# Patient Record
Sex: Female | Born: 1965
Health system: Southern US, Community
[De-identification: ages and names within clinical notes are randomized; demographics above are authoritative.]

## PROBLEM LIST (undated history)

## (undated) DIAGNOSIS — Z789 Other specified health status: Secondary | ICD-10-CM

## (undated) DIAGNOSIS — J45909 Unspecified asthma, uncomplicated: Secondary | ICD-10-CM

## (undated) DIAGNOSIS — N946 Dysmenorrhea, unspecified: Secondary | ICD-10-CM

## (undated) DIAGNOSIS — R202 Paresthesia of skin: Secondary | ICD-10-CM

## (undated) DIAGNOSIS — IMO0001 Reserved for inherently not codable concepts without codable children: Secondary | ICD-10-CM

## (undated) DIAGNOSIS — Z818 Family history of other mental and behavioral disorders: Secondary | ICD-10-CM

## (undated) DIAGNOSIS — R200 Anesthesia of skin: Secondary | ICD-10-CM

## (undated) DIAGNOSIS — I8390 Asymptomatic varicose veins of unspecified lower extremity: Secondary | ICD-10-CM

## (undated) DIAGNOSIS — M25511 Pain in right shoulder: Secondary | ICD-10-CM

## (undated) DIAGNOSIS — Z464 Encounter for fitting and adjustment of orthodontic device: Secondary | ICD-10-CM

## (undated) DIAGNOSIS — D649 Anemia, unspecified: Secondary | ICD-10-CM

## (undated) HISTORY — DX: Other specified health status: Z78.9

## (undated) HISTORY — DX: Dysmenorrhea, unspecified: N94.6

## (undated) HISTORY — DX: Family history of other mental and behavioral disorders: Z81.8

---

## 2014-02-25 HISTORY — PX: CATARACT EXTRACTION: SUR2

## 2015-02-26 HISTORY — PX: VARICOSE VEIN SURGERY: SHX832

## 2015-04-26 HISTORY — PX: AUGMENTATION MAMMAPLASTY: SUR837

## 2016-04-26 ENCOUNTER — Ambulatory Visit (INDEPENDENT_AMBULATORY_CARE_PROVIDER_SITE_OTHER): Payer: BLUE CROSS/BLUE SHIELD | Admitting: Obstetrics and Gynecology

## 2016-04-26 ENCOUNTER — Encounter: Payer: Self-pay | Admitting: Obstetrics and Gynecology

## 2016-04-26 ENCOUNTER — Ambulatory Visit: Payer: Self-pay | Admitting: Obstetrics and Gynecology

## 2016-04-26 VITALS — BP 100/62 | HR 70 | Resp 16 | Ht 63.5 in | Wt 121.8 lb

## 2016-04-26 DIAGNOSIS — R1900 Intra-abdominal and pelvic swelling, mass and lump, unspecified site: Secondary | ICD-10-CM | POA: Diagnosis not present

## 2016-04-26 DIAGNOSIS — R5383 Other fatigue: Secondary | ICD-10-CM | POA: Diagnosis not present

## 2016-04-26 LAB — CBC
HCT: 43.1 % (ref 35.0–45.0)
Hemoglobin: 14.2 g/dL (ref 11.7–15.5)
MCH: 31.6 pg (ref 27.0–33.0)
MCHC: 32.9 g/dL (ref 32.0–36.0)
MCV: 96 fL (ref 80.0–100.0)
MPV: 11.2 fL (ref 7.5–12.5)
PLATELETS: 294 10*3/uL (ref 140–400)
RBC: 4.49 MIL/uL (ref 3.80–5.10)
RDW: 14.1 % (ref 11.0–15.0)
WBC: 7.4 10*3/uL (ref 3.8–10.8)

## 2016-04-26 LAB — COMPREHENSIVE METABOLIC PANEL
ALT: 12 U/L (ref 6–29)
AST: 16 U/L (ref 10–35)
Albumin: 4.3 g/dL (ref 3.6–5.1)
Alkaline Phosphatase: 31 U/L — ABNORMAL LOW (ref 33–130)
BUN: 12 mg/dL (ref 7–25)
CHLORIDE: 99 mmol/L (ref 98–110)
CO2: 27 mmol/L (ref 20–31)
Calcium: 9.2 mg/dL (ref 8.6–10.4)
Creat: 0.69 mg/dL (ref 0.50–1.05)
GLUCOSE: 75 mg/dL (ref 65–99)
POTASSIUM: 3.7 mmol/L (ref 3.5–5.3)
Sodium: 137 mmol/L (ref 135–146)
Total Bilirubin: 0.6 mg/dL (ref 0.2–1.2)
Total Protein: 7 g/dL (ref 6.1–8.1)

## 2016-04-26 LAB — TSH: TSH: 1.57 mIU/L

## 2016-04-26 NOTE — Progress Notes (Signed)
GYNECOLOGY  VISIT   HPI: 51 y.o.   Married  Caucasian/[place]ian  female   Crystal Gardner with Patient's last menstrual period was 04/18/2016 (exact date).   here for  Pelvic pain.   Husband present for discussion today.   Saw a physician in [place] who diagnosed a complex left adnexal mass in Sept. 25, 2017.  Had follow up ultrasound 01/08/16 showing persistent mass.  Surgery was recommended.   Patient dealing with a lot of stress. International moves, visa, husband's child died from heroin overdose 2 months ago. She feels tired.  Patient living now in [place].   GYNECOLOGIC HISTORY: Patient's last menstrual period was 04/18/2016 (exact date). Contraception:  none Menopausal hormone therapy:  n/a Last mammogram:  04/2015 normal per patient in [place] Last pap smear:   10/2015 normal per patient in [place]        OB History    Gravida Para Term Preterm AB Living   1 1 1     1    SAB TAB Ectopic Multiple Live Births                     There are no active problems to display for this patient.   Past Medical History:  Diagnosis Date  . Dysmenorrhea   . Family history not obtainable due to adoption     Past Surgical History:  Procedure Laterality Date  . AUGMENTATION MAMMAPLASTY  [id number]   silicone implants--[place]  . CATARACT EXTRACTION Right 2016   --in [place]    Current Outpatient Prescriptions  Medication Sig Dispense Refill  . cholecalciferol (VITAMIN D) 1000 units tablet Take 1,000 Units by mouth daily.    [name] Kitchen FLAXSEED, LINSEED, PO Take 1 tablet by mouth daily.    [name] Kitchen ibuprofen (ADVIL,MOTRIN) 200 MG tablet Take 200 mg by mouth every 6 (six) hours as needed. Takes 2 tablets daily    . Omega-3 1000 MG CAPS Take 1 capsule by mouth daily.    Marnee Guarneri Isoflavones (SOY BALANCE PO) Take 1 tablet by mouth daily.    [name] Kitchen VITAMIN A PO Take 1 tablet by mouth daily.     No current facility-administered medications for this visit.      ALLERGIES: Patient has no known  allergies.  Family History  Problem Relation Age of Onset  . Adopted: Yes    Social History   Social History  . Marital status: Married    Spouse name: N/A  . Number of children: N/A  . Years of education: N/A   Occupational History  . Not on file.   Social History Main Topics  . Smoking status: Never Smoker  . Smokeless tobacco: Never Used  . Alcohol use 4.8 oz/week    8 Glasses of wine per week  . Drug use: Yes    Types: Marijuana  . Sexual activity: Yes    Birth control/ protection: None   Other Topics Concern  . Not on file   Social History Narrative  . No narrative on file    ROS:  Pertinent items are noted in HPI.  PHYSICAL EXAMINATION:    BP 100/62 (BP Location: Right Arm, Patient Position: Sitting, Cuff Size: Normal)   Pulse 70   Resp 16   Ht 5' 3.5" (1.613 m)   Wt 121 lb 12.8 oz (55.2 kg)   LMP 04/18/2016 (Exact Date)   BMI 21.24 kg/m     General appearance: alert, cooperative and appears stated age Head: Normocephalic, without obvious abnormality, atraumatic  Neck: no adenopathy, supple, symmetrical, trachea midline and thyroid normal to inspection and palpation Lungs: clear to auscultation bilaterally Breasts: bilateral implants, normal appearance, no masses or tenderness, No nipple retraction or dimpling, No nipple discharge or bleeding, No axillary or supraclavicular adenopathy Heart: regular rate and rhythm Abdomen: soft, non-tender, no masses,  no organomegaly Extremities: extremities normal, atraumatic, no cyanosis or edema Skin: Skin color, texture, turgor normal. No rashes or lesions Lymph nodes: Cervical, supraclavicular, and axillary nodes normal. No abnormal inguinal nodes palpated Neurologic: Grossly normal  Pelvic: External genitalia:  no lesions              Urethra:  normal appearing urethra with no masses, tenderness or lesions              Bartholins and Skenes: normal                 Vagina: normal appearing vagina with normal  color and discharge, no lesions              Cervix: no lesions                Bimanual Exam:  Uterus:  normal size, contour, position, consistency, mobility, non-tender              Adnexa: no mass, fullness, tenderness on right.  Left adnexal fullness which is slightly tender.               Rectal exam: Yes.  .  Confirms.              Anus:  normal sphincter tone, no lesions  Chaperone was present for exam.  ASSESSMENT  Left adnexal mass.  Fatigue.   PLAN  Will check CBC, TSH, CMP, vit D. Discussion of ovarian cysts.  Pelvic ultrasound next week.  Will check CA125 after ultrasound to evaluate cyst better.    [facility] so she can call to make this appointment.    An After Visit Summary was printed and given to the patient.  ___30___ minutes face to face time of which over 50% was spent in counseling.

## 2016-04-26 NOTE — Progress Notes (Signed)
Following appointment with Dr Quincy Simmonds, pelvic ultrasound scheduled for 05-02-16 at 1100 here in office. Patient and husband agreeable to date and time.

## 2016-04-27 LAB — VITAMIN D 25 HYDROXY (VIT D DEFICIENCY, FRACTURES): Vit D, 25-Hydroxy: 34 ng/mL (ref 30–100)

## 2016-04-29 ENCOUNTER — Telehealth: Payer: Self-pay | Admitting: Obstetrics and Gynecology

## 2016-04-29 NOTE — Telephone Encounter (Signed)
Called patient's spouse to review benefits for procedure. Left voicemail to call back and review.  Per note from order and patient's most recent DPR ok to speak with spouse Mr. [name] Poag due to language barrier.

## 2016-04-29 NOTE — Telephone Encounter (Signed)
Spoke with Mr. Crystal Gardner. Relayed all benefit and appointment information and answered all questions. No further concerns. Ok to close.

## 2016-05-01 ENCOUNTER — Telehealth: Payer: Self-pay | Admitting: Obstetrics and Gynecology

## 2016-05-01 NOTE — Telephone Encounter (Signed)
Spoke with patients spouse, Crystal Gardner (he is listed on the [facility]) he and the patient are agreeable to the appointment time change for 05/02/16. Crystal Gardner is aware the appointment time for 05/02/16 is 12:30 and not 11:00  Routing to [employer]

## 2016-05-01 NOTE — Telephone Encounter (Signed)
Called patient and left a voicemail requesting a return call. Upon return call will need to convey to patient appointment time for ultrasound on 05/02/16 has been moved from 11:00 am  to 12:30 pm tomorrow, 05/02/16.   Routing to [employer]

## 2016-05-01 NOTE — Telephone Encounter (Signed)
Routing to provider for final review. Patient agreeable to disposition. Will close encounter.     

## 2016-05-02 ENCOUNTER — Encounter: Payer: Self-pay | Admitting: Obstetrics and Gynecology

## 2016-05-02 ENCOUNTER — Other Ambulatory Visit: Payer: BLUE CROSS/BLUE SHIELD | Admitting: Obstetrics and Gynecology

## 2016-05-02 ENCOUNTER — Ambulatory Visit (INDEPENDENT_AMBULATORY_CARE_PROVIDER_SITE_OTHER): Payer: BLUE CROSS/BLUE SHIELD | Admitting: Obstetrics and Gynecology

## 2016-05-02 ENCOUNTER — Ambulatory Visit (INDEPENDENT_AMBULATORY_CARE_PROVIDER_SITE_OTHER): Payer: BLUE CROSS/BLUE SHIELD

## 2016-05-02 ENCOUNTER — Other Ambulatory Visit: Payer: BLUE CROSS/BLUE SHIELD

## 2016-05-02 VITALS — BP 110/68 | HR 70 | Ht 63.5 in | Wt 121.0 lb

## 2016-05-02 DIAGNOSIS — N83202 Unspecified ovarian cyst, left side: Secondary | ICD-10-CM | POA: Diagnosis not present

## 2016-05-02 DIAGNOSIS — R1900 Intra-abdominal and pelvic swelling, mass and lump, unspecified site: Secondary | ICD-10-CM | POA: Diagnosis not present

## 2016-05-02 NOTE — Progress Notes (Signed)
Patient ID: Crystal Gardner, female   DOB: [birth year], 51 y.o.   MRN: [id number] GYNECOLOGY  VISIT   HPI: 51 y.o.   Married  Caucasian/[place] female   Sylvester with Patient's last menstrual period was 04/18/2016 (exact date).   here for pelvic ultrasound for left adnexal mass.    Patient's husband present today.  Here for follow up of left adnexal mass present since September 2017 per patient report.  She came to her last visit with a note from her provider in [place] indicating the presence of a persistent left ovarian cyst which needed removal.  She had ultrasound in September and November 2017.  Menses regular. No excessive bleeding or prolonged menses or significant pain with cycles.  GYNECOLOGIC HISTORY: Patient's last menstrual period was 04/18/2016 (exact date). Contraception:  none Menopausal hormone therapy:  n/a Last mammogram:  04/2015 normal per patient in [place] Last pap smear:   9/2017normal per patient in [place]        OB History    Gravida Para Term Preterm AB Living   1 1 1     1    SAB TAB Ectopic Multiple Live Births                     There are no active problems to display for this patient.   Past Medical History:  Diagnosis Date  . Dysmenorrhea   . Family history not obtainable due to adoption     Past Surgical History:  Procedure Laterality Date  . AUGMENTATION MAMMAPLASTY  40/1027   silicone implants--[place]  . CATARACT EXTRACTION Right 2016   --in [place]    Current Outpatient Prescriptions  Medication Sig Dispense Refill  . cholecalciferol (VITAMIN D) 1000 units tablet Take 1,000 Units by mouth daily.    [name] Kitchen FLAXSEED, LINSEED, PO Take 1 tablet by mouth daily.    [name] Kitchen ibuprofen (ADVIL,MOTRIN) 200 MG tablet Take 200 mg by mouth every 6 (six) hours as needed. Takes 2 tablets daily    . Omega-3 1000 MG CAPS Take 1 capsule by mouth daily.    Marnee Guarneri Isoflavones (SOY BALANCE PO) Take 1 tablet by mouth daily.    [name] Kitchen VITAMIN A PO Take 1  tablet by mouth daily.     No current facility-administered medications for this visit.      ALLERGIES: Patient has no known allergies.  Family History  Problem Relation Age of Onset  . Adopted: Yes    Social History   Social History  . Marital status: Married    Spouse name: N/A  . Number of children: N/A  . Years of education: N/A   Occupational History  . Not on file.   Social History Main Topics  . Smoking status: Never Smoker  . Smokeless tobacco: Never Used  . Alcohol use 4.8 oz/week    8 Glasses of wine per week  . Drug use: Yes    Types: Marijuana  . Sexual activity: Yes    Birth control/ protection: None   Other Topics Concern  . Not on file   Social History Narrative  . No narrative on file    ROS:  Pertinent items are noted in HPI.  PHYSICAL EXAMINATION:    BP 110/68 (BP Location: Right Arm, Patient Position: Sitting, Cuff Size: Normal)   Pulse 70   Ht 5' 3.5" (1.613 m)   Wt 121 lb (54.9 kg)   LMP 04/18/2016 (Exact Date)   BMI 21.10 kg/m  General appearance: alert, cooperative and appears stated age   Pelvic ultrasound:   Uterus with 8 fibroids, intramural and subserosal, measuring approximately 1 - 2 cm.  EMS 5.99 mm. Right ovary normal.  Left ovary with cyst 41 mm, thick walled and thick partial septation, no abnormal blood flow.  Second cyst 20 mm with 5 mm calcification.  Dermoid? Follicle with 3 mm calcification.  CL also seen.  No free fluid.  ASSESSMENT  Persistent complex left adnexal mass.   Uterine fibroids.  Asymptomatic.    PLAN  Discussion of complex left adnexal mass, possible dermoid, cystadenoma. Will get CA125.  Limitations of use discussed in premenopausal women.  Can be elevated with fibroids, inflammation, endometriosis, menses, or with malignancy.  Discussion of laparoscopic left salpingo-oophorectomy, right salpingectomy, and collection of pelvic washings at a minimum for a surgical plan.  Hysterectomy is  optional but not necessary if the cyst is benign. Risks of laparoscopy include but are not limited to bleeding, infection, damage to surrounding organs, reaction to anesthesia, pneumonia, DVT, PE, death, hernia formation, and need for further surgery.    Surgical expectations and recovery discussed. If CA125 elevated, will send to [facility] for consultation.  An After Visit Summary was printed and given to the patient.  _40_____ minutes face to face time of which over 50% was spent in counseling.

## 2016-05-02 NOTE — Progress Notes (Signed)
Encounter reviewed by Dr. Brook Amundson C. Silva.  

## 2016-05-03 ENCOUNTER — Telehealth: Payer: Self-pay | Admitting: Obstetrics and Gynecology

## 2016-05-03 DIAGNOSIS — R971 Elevated cancer antigen 125 [CA 125]: Secondary | ICD-10-CM

## 2016-05-03 DIAGNOSIS — N83202 Unspecified ovarian cyst, left side: Secondary | ICD-10-CM

## 2016-05-03 LAB — CA 125: CA 125: 48 U/mL — ABNORMAL HIGH (ref <35)

## 2016-05-03 NOTE — Telephone Encounter (Signed)
Phone call to patient with test results.  No answer.  CA125 result is back.  It measures 48.  Upper limit of normal is 35, so this is minimally elevated.  I discussed with the patient having GYN ONC consultation if it is elevated.  I will follow through with this plan.  No details left on voice mail.  I just asked for her to return my call.

## 2016-05-04 ENCOUNTER — Encounter: Payer: Self-pay | Admitting: Obstetrics and Gynecology

## 2016-05-06 NOTE — Telephone Encounter (Signed)
Phone call to patient  NA.  I left a message asking her to return the call.  No details given.  I was calling because her CA125 is minimally elevated which was done to analyze her left ovarian cyst.  Our plan was referral to GYN ONC if this was elevated.  She is premenopausal and has fibroids also.

## 2016-05-07 NOTE — Telephone Encounter (Signed)
Patient and spouse returned your call from yesterday.  State you can call anytime tomorrow and look forward to hearing from you.

## 2016-05-08 NOTE — Telephone Encounter (Signed)
Phone call return to patient.  I discussed the mildly elevated CA125 in the setting of the left ovarian cyst and fibroid uterus and my recommendation to have a consultation with Dr. Denman George.  Patient understands and accepts this.  She will need to have an interpretor present for the consultation with Dr. Denman George.   Please facilitate the referral to Dr. Denman George.

## 2016-05-08 NOTE — Telephone Encounter (Signed)
Referral placed via EPIC to [place]. Spoke with Sharyn Lull at [employer] office who states records will need to be faxed in addition to referral in [facility] and she will contact the patient to schedule. Aware patient will need an interpretor for her consultation with Dr.Rossi. OV notes and labs from 05/02/2016 faxed to [facility] at [phone] with cover sheet and confirmation. Will hold chart to ensure she is scheduled.

## 2016-05-09 NOTE — Telephone Encounter (Signed)
Spoke with Crystal Gardner at [facility]. Per Sherril Croon does not have an appointment available for around a month or more. Appointment scheduled for earliest appointment with [facility] on 05/24/2016 at 9:45 am. Patient will need to arrive at 9:30 am. Spoke with patient's husband, Crystal Gardner, okay per ROI. Advised of appointment date and time. Crystal Gardner is agreeable to appointment date and time. Location provided as seen below. Reports it is okay for the patient to see a female provider and she will be okay with this. If not he will return call so adjustments can be made.  [address], [place] [address]  Main: [phone]   Routing to [place] for final review.

## 2016-05-09 NOTE — Telephone Encounter (Signed)
Thank you for facilitating this appointment with Dr. Fermin Schwab.  I would be happy for the patient to see him. You may close the encounter.

## 2016-05-09 NOTE — Telephone Encounter (Signed)
Spoke with patient's husband, [name]. Okay per ROI. [name] would like to review results. Husband has discussed results with his wife in [place], but wants to make sure he understands. Advised of all results and recommendations as seen below from [place]. Aware a referral has been placed to [facility] and her office will contact them to set up an appointment date and time. Advised I will keep an eye out for appointment to be scheduled. If not scheduled by this afternoon I will contact Dr.Rossi's office to check on status of scheduling. [name] is agreeable.

## 2016-05-24 ENCOUNTER — Encounter: Payer: Self-pay | Admitting: Gynecology

## 2016-05-24 ENCOUNTER — Ambulatory Visit: Payer: BLUE CROSS/BLUE SHIELD | Attending: Gynecology | Admitting: Gynecology

## 2016-05-24 VITALS — BP 106/62 | HR 78 | Temp 98.6°F | Resp 18 | Ht 63.98 in | Wt 119.5 lb

## 2016-05-24 DIAGNOSIS — R971 Elevated cancer antigen 125 [CA 125]: Secondary | ICD-10-CM | POA: Diagnosis not present

## 2016-05-24 DIAGNOSIS — N393 Stress incontinence (female) (male): Secondary | ICD-10-CM

## 2016-05-24 DIAGNOSIS — R1032 Left lower quadrant pain: Secondary | ICD-10-CM | POA: Diagnosis not present

## 2016-05-24 DIAGNOSIS — Z79899 Other long term (current) drug therapy: Secondary | ICD-10-CM | POA: Diagnosis not present

## 2016-05-24 DIAGNOSIS — N83202 Unspecified ovarian cyst, left side: Secondary | ICD-10-CM | POA: Insufficient documentation

## 2016-05-24 NOTE — Patient Instructions (Signed)
Please follow up with Dr Quincy Simmonds.  Dr Fermin Schwab will contact Dr Quincy Simmonds about the findings at this visit.

## 2016-05-24 NOTE — Progress Notes (Signed)
Consult Note: Gyn-Onc   Crystal Gardner 51 y.o. female  Chief Complaint  Patient presents with  . left ovarian cyst    Assessment :  Left ovarian mass which has been present for at least 2 years. Intermittent left lower quadrant pain and dyspareunia. History of stress urinary incontinence.  Plan: I had a good discussion with the patient and her husband (through an interpreter) regarding the findings on ultrasound and her pelvic symptoms. Despite the fact that the CA-125 is slightly elevated (48 units per mL) I believe this is a benign adnexal mass. I agree with Dr. Elza Rafter recommendation that the mass be removed laparoscopically. I also agree that removing the contralateral fallopian tube was appropriate. At the same time, if appropriate, her stress urinary incontinence should be addressed. All questions are answered. The patient will return to Dr. Quincy Simmonds for further gynecologic management.    HPI: 51 year old seen in consultation request of Dr. Rolly Pancake regarding management of a left adnexal mass. Over the mass and present for approximately 2 years. The patient has occasional left lower quadrant pain and dyspareunia. Ultrasound obtained on 05/12/2016 shows a left ovarian cyst with follicles measuring 4.1 x 2.8 x 3.3 cm with thick partial septation. There is no vascularity and the margins are smooth. There is some calcification present. The right ovary appears normal there is no free fluid in the pelvis CA-125 was 48 units per mL (the patient is premenopausal). Patient has regular cyclic menses and denies any past gynecologic history. She does have relatively new onset of urinary incontinence. She has her Pap smears were always been normal. Obstetrical history gravida 31 (52 year old son)   Review of Systems:10 point review of systems is negative except as noted in interval history.   Vitals: Blood pressure 106/62, pulse 78, temperature 98.6 F (37 C), temperature source Oral, resp. rate  18, height 5' 3.98" (1.625 m), weight 119 lb 8 oz (54.2 kg).  Physical Exam: General : The patient is a healthy woman in no acute distress.  HEENT: normocephalic, extraoccular movements normal; neck is supple without thyromegally  Lynphnodes: Supraclavicular and inguinal nodes not enlarged  Abdomen: Soft, non-tender, no ascites, no organomegally, no masses, no hernias  Pelvic:  EGBUS: Normal female  Vagina: Normal, no lesions  Urethra and Bladder: Normal, non-tender  Cervix: Normal  Uterus: Anterior small mobile nontender. Bi-manual examination: Non-tender; no adenxal masses or nodularity I'm unable to palpate a mass. Rectal: normal sphincter tone, no masses, no blood  Lower extremities: No edema or varicosities. Normal range of motion      No Known Allergies  Past Medical History:  Diagnosis Date  . Dysmenorrhea   . Family history not obtainable due to adoption     Past Surgical History:  Procedure Laterality Date  . AUGMENTATION MAMMAPLASTY  09/6576   silicone implants--[place]  . CATARACT EXTRACTION Right 2016   --in [place]    Current Outpatient Prescriptions  Medication Sig Dispense Refill  . cholecalciferol (VITAMIN D) 1000 units tablet Take 1,000 Units by mouth daily.    [name] Kitchen FLAXSEED, LINSEED, PO Take 1 tablet by mouth daily.    [name] Kitchen ibuprofen (ADVIL,MOTRIN) 200 MG tablet Take 200 mg by mouth every 6 (six) hours as needed. Takes 2 tablets daily    . Omega-3 1000 MG CAPS Take 1 capsule by mouth daily.    Marnee Guarneri Isoflavones (SOY BALANCE PO) Take 1 tablet by mouth daily.    [name] Kitchen VITAMIN A PO Take 1 tablet by mouth daily.  No current facility-administered medications for this visit.     Social History   Social History  . Marital status: Married    Spouse name: N/A  . Number of children: N/A  . Years of education: N/A   Occupational History  . Not on file.   Social History Main Topics  . Smoking status: Never Smoker  . Smokeless tobacco: Never Used  .  Alcohol use 4.8 oz/week    8 Glasses of wine per week     Comment: wine , beer  . Drug use: Yes    Types: Marijuana  . Sexual activity: Yes    Birth control/ protection: None   Other Topics Concern  . Not on file   Social History Narrative  . No narrative on file    Family History  Problem Relation Age of Onset  . Adopted: Yes      Marti Sleigh, MD 05/24/2016, 9:54 AM      Consult Note: Gyn-Onc   Crystal Gardner 51 y.o. female  Chief Complaint  Patient presents with  . left ovarian cyst    Assessment :  Plan:  Interval History:   HPI:  Review of Systems:10 point review of systems is negative except as noted in interval history.   Vitals: Blood pressure 106/62, pulse 78, temperature 98.6 F (37 C), temperature source Oral, resp. rate 18, height 5' 3.98" (1.625 m), weight 119 lb 8 oz (54.2 kg).  Physical Exam: General : The patient is a healthy woman in no acute distress.  HEENT: normocephalic, extraoccular movements normal; neck is supple without thyromegally  Lynphnodes: Supraclavicular and inguinal nodes not enlarged  Abdomen: Soft, non-tender, no ascites, no organomegally, no masses, no hernias  Pelvic:  EGBUS: Normal female  Vagina: Normal, no lesions  Urethra and Bladder: Normal, non-tender  Cervix: Surgically absent  Uterus: Surgically absent  Bi-manual examination: Non-tender; no adenxal masses or nodularity  Rectal: normal sphincter tone, no masses, no blood  Lower extremities: No edema or varicosities. Normal range of motion      No Known Allergies  Past Medical History:  Diagnosis Date  . Dysmenorrhea   . Family history not obtainable due to adoption     Past Surgical History:  Procedure Laterality Date  . AUGMENTATION MAMMAPLASTY  52/8891   silicone implants--[place]  . CATARACT EXTRACTION Right 2016   --in [place]    Current Outpatient Prescriptions  Medication Sig Dispense Refill  . cholecalciferol (VITAMIN  D) 1000 units tablet Take 1,000 Units by mouth daily.    [name] Kitchen FLAXSEED, LINSEED, PO Take 1 tablet by mouth daily.    [name] Kitchen ibuprofen (ADVIL,MOTRIN) 200 MG tablet Take 200 mg by mouth every 6 (six) hours as needed. Takes 2 tablets daily    . Omega-3 1000 MG CAPS Take 1 capsule by mouth daily.    Marnee Guarneri Isoflavones (SOY BALANCE PO) Take 1 tablet by mouth daily.    [name] Kitchen VITAMIN A PO Take 1 tablet by mouth daily.     No current facility-administered medications for this visit.     Social History   Social History  . Marital status: Married    Spouse name: N/A  . Number of children: N/A  . Years of education: N/A   Occupational History  . Not on file.   Social History Main Topics  . Smoking status: Never Smoker  . Smokeless tobacco: Never Used  . Alcohol use 4.8 oz/week    8 Glasses of wine per week  Comment: wine , beer  . Drug use: Yes    Types: Marijuana  . Sexual activity: Yes    Birth control/ protection: None   Other Topics Concern  . Not on file   Social History Narrative  . No narrative on file    Family History  Problem Relation Age of Onset  . Adopted: Yes      Marti Sleigh, MD 05/24/2016, 9:54 AM

## 2016-05-27 ENCOUNTER — Telehealth: Payer: Self-pay | Admitting: Obstetrics and Gynecology

## 2016-05-27 NOTE — Telephone Encounter (Signed)
Routing to Dr.Silva for review. 

## 2016-05-27 NOTE — Telephone Encounter (Signed)
Patients spouse, Hilbert Bible called to advise, patient's appointment with [facility], with Dr Aldean Ast, has been completed. They were advised that patient is cleared to proceed with surgery. [name] would like to know the next steps in order to proceed with with surgery.  Mr. Myra Gianotti requested, if Dr Quincy Simmonds could call the patient directly, as they both speak [place], that would be great. If a nurse will be returning the call, he requested that call be placed to him at [phone]. Mr. Myra Gianotti is listed on patients Designated Party Release.  Routing to Triage for review

## 2016-05-28 NOTE — Telephone Encounter (Signed)
We will need to precert laparoscopic left oophorectomy and bilateral salpingectomy with collection of pelvic washings.   At her consultation with Dr. Fermin Schwab, there was a question about urinary incontinence treatment.  If the patient wishes for further evaluation and potential treatment this at the same surgical occasion, she will need to return for further consultation prior to proceeding.

## 2016-05-28 NOTE — Telephone Encounter (Signed)
Spoke with patient's spouse [name] Poag, okay per ROI. Advised of message as seen below from [place]. [name] verbalizes understanding. [name] states that he is unsure how his wife feels about further evaluation and treatment for urinary incontinence. Would like to schedule an appointment so that the patient can discuss surgery and urinary incontinence. Appointment scheduled for 05/30/2016 at 2:30 pm with Dr.Silva.   Routing to provider for final review. Patient agreeable to disposition. Will close encounter.

## 2016-05-29 NOTE — Progress Notes (Signed)
GYNECOLOGY  VISIT   HPI: 51 y.o.   Married  Caucasian/[place]ian female   [place] with Patient's last menstrual period was 05/19/2016 (exact date).   here to evaluate urinary incontinence and schedule surgery for left ovarian cyst.   Husband present for the entire visit.  Patient complains of leaking urine especially with coughing, sneezing and laughing x 2 years.  Happens sometimes especially prior to or after menses.  Avoiding exercise due to leakage. DF - every 2 hours. NF - 3 - 4 times.  Has urgency but no urge incontinence.  Voiding well.  No prior urinary incontinence surgery.   BMs are normal.  No splinting or fecal incontinence.   Decreased appetite.   Has cramps with menses and leg heaviness. Has lower back pain.   Known fibroids by ultrasound on 05/02/16.  Has 8 fibroids ranging from 0.77 - 1.43 cm.  Patient has known persistent complex left ovarian cyst 41 x 28 x 33 mm and CA125 of 48. Had GYN ONC consultation with Dr. Fermin Schwab who believes the cyst to be benign and that proceeding with me doing her laparoscopic left ovarian cystectomy and bilateral salpingectomy is appropriate. Patient feeling very relieved.  GYNECOLOGIC HISTORY: Patient's last menstrual period was 05/19/2016 (exact date). Contraception:  none Menopausal hormone therapy:  n/a Last mammogram:  04/2015 normal in [place] per patient Last pap smear: 10/2015 normal in [place] per patient        OB History    Gravida Para Term Preterm AB Living   1 1 1     1    SAB TAB Ectopic Multiple Live Births                     There are no active problems to display for this patient.   Past Medical History:  Diagnosis Date  . Dysmenorrhea   . Family history not obtainable due to adoption     Past Surgical History:  Procedure Laterality Date  . AUGMENTATION MAMMAPLASTY  44/0102   silicone implants--[place]  . CATARACT EXTRACTION Right 2016   --in [place]  . VARICOSE VEIN SURGERY  2017   in [place]     Current Outpatient Prescriptions  Medication Sig Dispense Refill  . cholecalciferol (VITAMIN D) 1000 units tablet Take 1,000 Units by mouth daily.    [name] Kitchen FLAXSEED, LINSEED, PO Take 1 tablet by mouth daily.    [name] Kitchen ibuprofen (ADVIL,MOTRIN) 200 MG tablet Take 200 mg by mouth every 6 (six) hours as needed. Takes 2 tablets daily    . Omega-3 1000 MG CAPS Take 1 capsule by mouth daily.    Marnee Guarneri Isoflavones (SOY BALANCE PO) Take 1 tablet by mouth daily.    [name] Kitchen VITAMIN A PO Take 1 tablet by mouth daily.     No current facility-administered medications for this visit.      ALLERGIES: Patient has no known allergies.  Family History  Problem Relation Age of Onset  . Adopted: Yes    Social History   Social History  . Marital status: Married    Spouse name: N/A  . Number of children: N/A  . Years of education: N/A   Occupational History  . Not on file.   Social History Main Topics  . Smoking status: Never Smoker  . Smokeless tobacco: Never Used  . Alcohol use 4.8 oz/week    8 Glasses of wine per week     Comment: wine , beer  . Drug use: Yes  Types: Marijuana  . Sexual activity: Yes    Partners: Male    Birth control/ protection: None   Other Topics Concern  . Not on file   Social History Narrative  . No narrative on file    ROS:  Pertinent items are noted in HPI.  PHYSICAL EXAMINATION:    BP 100/62 (BP Location: Right Arm, Patient Position: Sitting, Cuff Size: Normal)   Pulse 66   Ht 5' 3.5" (1.613 m)   Wt 115 lb 6.4 oz (52.3 kg)   LMP 05/19/2016 (Exact Date)   BMI 20.12 kg/m     General appearance: alert, cooperative and appears stated age.  Wearing a big smile today.  Pelvic: External genitalia:  no lesions              Urethra:  normal appearing urethra with no masses, tenderness or lesions              Bartholins and Skenes: normal                 Vagina: normal appearing vagina with normal color and discharge, no lesions.  Minimal cystocele.               Cervix: no lesions                Bimanual Exam:  Uterus: slightly enlarged, mobile, and non-tender              Adnexa: left adnexal fullness.            Chaperone was present for exam.  ASSESSMENT  Complex left ovarian mass.  Likely benign.  Mildly elevated CA125.   Fibroids.  Dysmenorrhea. Stress incontinence.  PLAN  We discussed laparoscopic left oophorectomy, bilateral salpingectomy, and collection of pelvic washings.  Our goal is to maintain her right ovary. We discussed, physical therapy, and observational management, and mid-urethral sling using permanent mesh for genuine stress incontinence surgical treatment. We reviewed continence rates of mid-urethral sling being approximately 85 - 90 %. We discussed potential specific complications of a mid-urethral sling being urinary retention; cystotomy; mesh erosions/exposures which can cause pain, damage to surrounding organs, and lead to need for vaginal estrogen treatment or reoperation; perioperative urinary tract infections; urinary urgency; and slower voiding.  Patient is very interested in mid-urethral sling, so we will have her return to do urodynamic testing.  We discussed simple cystometrics versus multichannel urodynamics, and she prefers the latter. We did also broach the possibility of laparoscopic hysterectomy due to the pain she is having with her cycles. She is very please to know that the mass is likely benign, and she wants to return to a good quality of life which for her may include the midurethral sling and hysterectomy. "I know what I want." Surgical recovery of 8 weeks to return to normal activity discussed.   An After Visit Summary was printed and given to the patient.  __40_____ minutes face to face time of which over 50% was spent in counseling.

## 2016-05-30 ENCOUNTER — Ambulatory Visit: Payer: BLUE CROSS/BLUE SHIELD | Admitting: Obstetrics and Gynecology

## 2016-05-30 ENCOUNTER — Encounter: Payer: Self-pay | Admitting: Obstetrics and Gynecology

## 2016-05-30 VITALS — BP 100/62 | HR 66 | Ht 63.5 in | Wt 115.4 lb

## 2016-05-30 DIAGNOSIS — N393 Stress incontinence (female) (male): Secondary | ICD-10-CM | POA: Diagnosis not present

## 2016-05-30 DIAGNOSIS — N946 Dysmenorrhea, unspecified: Secondary | ICD-10-CM | POA: Diagnosis not present

## 2016-05-30 DIAGNOSIS — D259 Leiomyoma of uterus, unspecified: Secondary | ICD-10-CM | POA: Diagnosis not present

## 2016-06-06 ENCOUNTER — Telehealth: Payer: Self-pay

## 2016-06-06 NOTE — Telephone Encounter (Addendum)
Spoke with patient's husband, [name], okay per ROI at time of incoming call. [name] states he spoke with someone at our office yesterday regarding urodynamics testing for patient. Patient is scheduled for 07/03/2016 at 11 am with Dr.Silva. [name] states that he was advised a packet would be mailed to their home regarding Urodynamics testing. Asking if a version of this can be sent in [place] or [place] as the patient is unable to read [place]. Advised will speak with Dr.Silva and return call with further recommendations. [name] is agreeable.

## 2016-06-06 NOTE — Telephone Encounter (Signed)
Spoke with patient's husband [name], okay per ROI. Advised of message as seen below from [place]. He is agreeable and verbalizes understanding.  Routing to [employer] regarding interpretor for patient's appointment on 07/03/2016.  Routing to provider for final review. Patient agreeable to disposition. Will close encounter.

## 2016-06-06 NOTE — Telephone Encounter (Signed)
Instructions for urodynamics are not available in other languages.  Will need to be in [place].  Please have an interpretor present on the day of the urodynamics testing and for follow up consultations.

## 2016-06-13 ENCOUNTER — Telehealth: Payer: Self-pay | Admitting: Obstetrics and Gynecology

## 2016-06-13 NOTE — Telephone Encounter (Signed)
Patient's husband is calling to see if the information package for the procedure has been mailed out yet.

## 2016-06-13 NOTE — Telephone Encounter (Signed)
Routing to Summer for advise as she assisted with scheduling patient's urodynamics.

## 2016-06-14 NOTE — Telephone Encounter (Signed)
Spoke to [name] (husband and on [place]) and advised that the letter was mailed out this week. Also let him know that he and Monita will see Dr. Quincy Simmonds after the procedure to discuss the results.

## 2016-06-19 ENCOUNTER — Telehealth: Payer: Self-pay | Admitting: Obstetrics and Gynecology

## 2016-06-19 NOTE — Telephone Encounter (Signed)
Spoke with patient's husband, [name], okay per ROI. [name] states that he received letter in the mail today regarding Urodynamics. No further questions at this time.  Routing to provider for final review. Patient agreeable to disposition. Will close encounter.

## 2016-06-19 NOTE — Telephone Encounter (Signed)
Patient's spouse [name] is calling regarding he information package his wife was suppose to be mailed for her upcoming urodynamics. They still have not received the package. He is asking if perhaps this should be mailed again. [name] said that they live very close to the office and should have received the mail. DPR- on file to talk with [name].

## 2016-06-26 ENCOUNTER — Ambulatory Visit (INDEPENDENT_AMBULATORY_CARE_PROVIDER_SITE_OTHER): Payer: BLUE CROSS/BLUE SHIELD

## 2016-06-26 VITALS — BP 120/80 | HR 72 | Resp 12 | Ht 63.5 in | Wt 115.0 lb

## 2016-06-26 DIAGNOSIS — Z01812 Encounter for preprocedural laboratory examination: Secondary | ICD-10-CM

## 2016-06-26 LAB — POCT URINALYSIS DIPSTICK
BILIRUBIN UA: NEGATIVE
Blood, UA: NEGATIVE
Glucose, UA: NEGATIVE
KETONES UA: NEGATIVE
LEUKOCYTES UA: NEGATIVE
Nitrite, UA: NEGATIVE
PH UA: 7 (ref 5.0–8.0)
Protein, UA: NEGATIVE
Urobilinogen, UA: 0.2 E.U./dL

## 2016-06-26 NOTE — Progress Notes (Signed)
Patient here for u/a for urodynamics that is scheduled for 07/03/16 @ 11am.   Interpreter used and instructions reviewed for urodynamics. Patient understood directions. Patient feels good with no associated problems.  Interpreter ID # [id number].  Routed to provider and encounter closed.

## 2016-07-03 ENCOUNTER — Ambulatory Visit (INDEPENDENT_AMBULATORY_CARE_PROVIDER_SITE_OTHER): Payer: BLUE CROSS/BLUE SHIELD

## 2016-07-03 ENCOUNTER — Ambulatory Visit (INDEPENDENT_AMBULATORY_CARE_PROVIDER_SITE_OTHER): Payer: BLUE CROSS/BLUE SHIELD | Admitting: Obstetrics and Gynecology

## 2016-07-03 VITALS — BP 110/70 | HR 80 | Resp 20 | Ht 63.5 in | Wt 117.8 lb

## 2016-07-03 DIAGNOSIS — N393 Stress incontinence (female) (male): Secondary | ICD-10-CM

## 2016-07-03 DIAGNOSIS — N83202 Unspecified ovarian cyst, left side: Secondary | ICD-10-CM | POA: Diagnosis not present

## 2016-07-03 DIAGNOSIS — N946 Dysmenorrhea, unspecified: Secondary | ICD-10-CM | POA: Diagnosis not present

## 2016-07-03 DIAGNOSIS — D259 Leiomyoma of uterus, unspecified: Secondary | ICD-10-CM

## 2016-07-03 MED ORDER — CIPROFLOXACIN HCL 250 MG PO TABS: 250.0000 mg | ORAL_TABLET | Freq: Two times a day (BID) | ORAL | 0 refills | Status: DC

## 2016-07-03 NOTE — Progress Notes (Signed)
GYNECOLOGY  VISIT   HPI: 51 y.o.   Married  Caucasian/[place]ian  female   [place] with Patient's last menstrual period was 06/18/2016.   here to discuss results of urodynamics testing performed today.    Husband and interpretor present for the entire discussion today.  Patient has urinary incontinence with coughing, laughing, and sneezing.  Multichannel urodynamic testing showing LPP of 146 cm H20.  Evidence of detrusor contractions.  Uroflow with void of 289 cc and PVR of 15 cc.   Catheterization was painful.   Also has painful menses and a known complex left ovarian cyst and a mildly elevated CA125 of 48. Has 8 fibroids between 0.7 and 1.4 cm.  GYN ONC consultation with Dr. Fermin Schwab who believes the cyst is benign in nature.   Patient is having pain in her legs during her menstruation.  Also having dsppareunia.  GYNECOLOGIC HISTORY: Patient's last menstrual period was 06/18/2016. Contraception: None Menopausal hormone therapy:  none Last mammogram: 04/2015 normal in [place] per patient Last pap smear:  10/2015 normal in [place] per patient        OB History    Gravida Para Term Preterm AB Living   1 1 1     1    SAB TAB Ectopic Multiple Live Births                     There are no active problems to display for this patient.   Past Medical History:  Diagnosis Date  . Dysmenorrhea   . Family history not obtainable due to adoption     Past Surgical History:  Procedure Laterality Date  . AUGMENTATION MAMMAPLASTY  82/9937   silicone implants--[place]  . CATARACT EXTRACTION Right 2016   --in [place]  . VARICOSE VEIN SURGERY  2017   in [place]    Current Outpatient Prescriptions  Medication Sig Dispense Refill  . cholecalciferol (VITAMIN D) 1000 units tablet Take 1,000 Units by mouth daily.    [name] Kitchen FLAXSEED, LINSEED, PO Take 1 tablet by mouth daily.    [name] Kitchen ibuprofen (ADVIL,MOTRIN) 200 MG tablet Take 200 mg by mouth every 6 (six) hours as needed. Takes 2 tablets daily     . Omega-3 1000 MG CAPS Take 1 capsule by mouth daily.    Marnee Guarneri Isoflavones (SOY BALANCE PO) Take 1 tablet by mouth daily.    [name] Kitchen VITAMIN A PO Take 1 tablet by mouth daily.     No current facility-administered medications for this visit.      ALLERGIES: Patient has no known allergies.  Family History  Problem Relation Age of Onset  . Adopted: Yes    Social History   Social History  . Marital status: Married    Spouse name: N/A  . Number of children: N/A  . Years of education: N/A   Occupational History  . Not on file.   Social History Main Topics  . Smoking status: Never Smoker  . Smokeless tobacco: Never Used  . Alcohol use 4.8 oz/week    8 Glasses of wine per week     Comment: wine , beer  . Drug use: Yes    Types: Marijuana  . Sexual activity: Yes    Partners: Male    Birth control/ protection: None   Other Topics Concern  . Not on file   Social History Narrative  . No narrative on file    ROS:  Pertinent items are noted in HPI.  PHYSICAL EXAMINATION:    BP  110/70 (BP Location: Right Arm, Patient Position: Sitting, Cuff Size: Normal)   Pulse 80   Resp 20   Ht 5' 3.5" (1.613 m)   Wt 117 lb 12.8 oz (53.4 kg)   LMP 06/18/2016   BMI 20.54 kg/m     General appearance: alert, cooperative and appears stated age   ASSESSMENT  Stress incontinence.  Confirmed with urodynamics. Uterine fibroids.  Dysmenorrhea.  Complex left ovarian cyst.    PLAN  Comprehensive discussion regarding urinary incontinence and tx options of PT versus a midurethral sling and cystoscopy.  Efficacy of 85 - 90% continence following the sling reviewed. Risks include but are not limited to cystotomy, mesh erosion and exposure requiring further care including future surgery, urinary retention, catheterization following the procedure, and urinary urgency.  Ciprofloxacin 250 mg po bid for 1 day for prevention of UTI following procedure today.   Comprehensive discussion  regarding dysmenorrhea and treatment options for medical therapy such as hormonal birth control and laparoscopic hysterectomy with bilateral salpingectomy, left oophorectomy, and possible right oophorectomy reviewed.  Risks include but are not limited to bleeding, infection, damage to surrounding organs, pneumonia, DVT, PE, death, need for reoperation, continued pelvic pain, and menopausal symptoms if both ovaries are removed.  She understands I cannot give her a guarantee that she will not have further pelvic pain, although I do anticipate that hysterectomy will improve if not alleviate this.  Surgical expectations and recovery discussed.   Patient wishes to proceed with total laparoscopic hysterectomy with bilateral salpingectomy, left oophorectomy, possible right oophorectomy, collection of pelvic washings, TVT Exact midurethral sling and cystoscopy.    Discussed May 29 as a potential date for her surgery.    An After Visit Summary was printed and given to the patient.  __40____ minutes face to face time of which over 50% was spent in counseling.

## 2016-07-03 NOTE — Progress Notes (Signed)
Crystal Gardner is a 51 y.o. female Who presents today for urodynamics testings. Allergies and medications reviewed. Consent for procedure signed. Interpreter was present during procedure.   Denies complaints today. No urinary complaints.  Urine Micro exam: negative for WBC's or RBC's, okay to proceed per Dr. Quincy Simmonds.  Patient reports urinary leakage with coughing, sneezing and laughing x 2 years. Avoids exercise due to leakage.Also feels she leaks with very full bladder.   Post void residual 15 ml.   Urodynamics testing initiated.  Please see scanned Patient summary report in Epic.  Cipro 250 mg bid x 1 day ordered by Dr. Quincy Simmonds and sent to patient's pharmacy of choice. Patient aware.   Patient given post procedure instructions:  You may have a mild bladder and rectal discomfort for a few hours after the test. You may experience some frequent urination and slight burning the first few times you urinate after the test. Rarely, the urine may be blood tinged. These are both due to catheter placements and resolve quickly. You should call our office immediately if you have signs of infection, which may include bladder pain, urinary urgency, fever, or burning during urination. We do encourage you to drink plenty of water after the test. Cipro as directed.   Charges: [phone]

## 2016-07-04 ENCOUNTER — Encounter: Payer: Self-pay | Admitting: Obstetrics and Gynecology

## 2016-07-09 ENCOUNTER — Telehealth: Payer: Self-pay | Admitting: Obstetrics and Gynecology

## 2016-07-09 NOTE — Telephone Encounter (Signed)
Patients spouse, Crystal Gardner, called to obtain benefit information. Crystal. Crystal Gardner is listed on the most recent designated party release. Reviewed benefit for recommended surgery. Crystal Gardner understood information presented and is agreeable. Spouse is aware this is professional benefit only, he is also aware they will be contacted by hospital for separate benefits.   Crystal Gardner, confirms they are wanting to secure the surgery date of 07/23/16, but states he is driving and will need to call back to confirm and proceed with scheduling.  Routing to Dr Quincy Simmonds  cc: [name]

## 2016-07-09 NOTE — Telephone Encounter (Signed)
Call placed to patient's cell phone,  to review benefits for recommended surgical procedure. Patients spouse, Crystal Gardner, answer the phone (he is listed on most recent designated party release).  [name] advised now is not a good time to talk, as they are in the process of checking out of a hotel in [facility]. [name] states he will call back today, at a more convenient time.  Routing to Dr Quincy Simmonds  cc: [name]

## 2016-07-09 NOTE — Telephone Encounter (Signed)
Received call from patients spouse, Crystal Gardner.  Crystal Gardner has confirmed surgery benefits and is ready to proceed with scheduling recommended surgery.  Advised, Crystal Gardner, our nurse supervisor will be contacting him for scheduling. They are agreeable to a return call.  Routing to Dr Quincy Simmonds  cc: [name]

## 2016-07-10 NOTE — Telephone Encounter (Signed)
Thank you for the update.  Cc- Sally Yeakley 

## 2016-07-10 NOTE — Progress Notes (Signed)
Multichannel urodynamic testing Uroflow:  Void 289 cc.  PVR 15 cc.  Continuous.  CMG:  S1 152 cc, S2 327 cc, max cystometric capacity 462.5 cc. VLPP of 146 cm H20 at 467 cc. Evidence of detrusor contractions.    Evidence of stress incontinence and probable overactive bladder.

## 2016-07-11 NOTE — Telephone Encounter (Signed)
Left message to call Kaitlyn at [phone]. 

## 2016-07-11 NOTE — Telephone Encounter (Signed)
Routing to provider for final review. Patient agreeable to disposition. Will close encounter.     

## 2016-07-11 NOTE — Telephone Encounter (Signed)
Spoke with patient's husband [name], okay per ROI. Surgery information form reviewed. Advised surgery date has been confirmed for 07/23/2016 at 7:30 am at the [facility] the hospital will contact him to discuss arrival time and to set up a pre-op appointment with the hospital. Pre-op appointment with Dr.Silva scheduled for 07/12/2016 at 1 pm. 1 week post op scheduled for 07/29/2016 at 1 pm with Dr.Silva. 6 week post op scheduled for 09/02/2016 at 1 pm with Dr.Silva. Husband verbalizes understanding of all information given. Aware he will be given the surgery information form at pre-op visit with Dr.Silva on 07/12/2016.  Cc: [name], RN  Routing to provider for final review.

## 2016-07-11 NOTE — Telephone Encounter (Signed)
Patient's spouse [name] is calling for update on scheduling his wife's surgery.

## 2016-07-12 ENCOUNTER — Encounter: Payer: Self-pay | Admitting: Obstetrics and Gynecology

## 2016-07-12 ENCOUNTER — Ambulatory Visit (INDEPENDENT_AMBULATORY_CARE_PROVIDER_SITE_OTHER): Payer: BLUE CROSS/BLUE SHIELD | Admitting: Obstetrics and Gynecology

## 2016-07-12 ENCOUNTER — Telehealth: Payer: Self-pay | Admitting: Obstetrics and Gynecology

## 2016-07-12 ENCOUNTER — Other Ambulatory Visit: Payer: Self-pay | Admitting: Obstetrics and Gynecology

## 2016-07-12 VITALS — BP 122/80 | HR 76 | Ht 63.5 in | Wt 117.0 lb

## 2016-07-12 DIAGNOSIS — N926 Irregular menstruation, unspecified: Secondary | ICD-10-CM | POA: Diagnosis not present

## 2016-07-12 DIAGNOSIS — N393 Stress incontinence (female) (male): Secondary | ICD-10-CM

## 2016-07-12 DIAGNOSIS — Z1231 Encounter for screening mammogram for malignant neoplasm of breast: Secondary | ICD-10-CM

## 2016-07-12 DIAGNOSIS — D259 Leiomyoma of uterus, unspecified: Secondary | ICD-10-CM

## 2016-07-12 DIAGNOSIS — N83202 Unspecified ovarian cyst, left side: Secondary | ICD-10-CM

## 2016-07-12 DIAGNOSIS — N946 Dysmenorrhea, unspecified: Secondary | ICD-10-CM

## 2016-07-12 LAB — POCT URINE PREGNANCY: Preg Test, Ur: NEGATIVE

## 2016-07-12 NOTE — Telephone Encounter (Signed)
Mr. Crystal Gardner called back in requesting the contact number at [facility] to confirm pre-op visit details. I relayed the number to him from [place]: [phone].   Per Gay Filler, I also relayed the following information about the patient's Invisiline plastic teeth trays: Both trays will be removed prior to surgery. The cemented metal post will be reported to the anesthesiologist for monitoring during surgery.  Mr. Crystal Gardner expressed understanding and appreciation.

## 2016-07-12 NOTE — Addendum Note (Signed)
Addended by: Lowella Fairy on: 07/12/2016 02:23 PM   Modules accepted: Orders

## 2016-07-12 NOTE — Progress Notes (Addendum)
GYNECOLOGY  VISIT   HPI: 51 y.o.   Married  Caucasian/[place]ian  female   [place] with Patient's last menstrual period was 06/18/2016.   here for surgical consult.   Planning surgery for left ovarian cyst, fibroids, dysmenorrhea, and stress incontinence.  Full consultation done on 07/03/16, the day of her urodynamics.  Denies dysuria.   2 months with no true menstruation. Some cramping now.   Needs mammogram scheduled.  GYNECOLOGIC HISTORY: Patient's last menstrual period was 06/18/2016. Contraception:  none Menopausal hormone therapy: none Last mammogram: 04/2015 normal per patient in [place] Last pap smear: 10/2015 normal per patient in [place]        OB History    Gravida Para Term Preterm AB Living   1 1 1     1    SAB TAB Ectopic Multiple Live Births                     There are no active problems to display for this patient.   Past Medical History:  Diagnosis Date  . Dysmenorrhea   . Family history not obtainable due to adoption     Past Surgical History:  Procedure Laterality Date  . AUGMENTATION MAMMAPLASTY  24/4010   silicone implants--[place]  . CATARACT EXTRACTION Right 2016   --in [place]  . VARICOSE VEIN SURGERY  2017   in [place]    Current Outpatient Prescriptions  Medication Sig Dispense Refill  . cholecalciferol (VITAMIN D) 1000 units tablet Take 1,000 Units by mouth daily.    . ciprofloxacin (CIPRO) 250 MG tablet Take 1 tablet (250 mg total) by mouth 2 (two) times daily. 2 tablet 0  . FLAXSEED, LINSEED, PO Take 1 tablet by mouth daily.    [name] Kitchen ibuprofen (ADVIL,MOTRIN) 200 MG tablet Take 200 mg by mouth every 6 (six) hours as needed. Takes 2 tablets daily    . Omega-3 1000 MG CAPS Take 1 capsule by mouth daily.    Marnee Guarneri Isoflavones (SOY BALANCE PO) Take 1 tablet by mouth daily.    [name] Kitchen VITAMIN A PO Take 1 tablet by mouth daily.     No current facility-administered medications for this visit.      ALLERGIES: Patient has no known  allergies.  Family History  Problem Relation Age of Onset  . Adopted: Yes    Social History   Social History  . Marital status: Married    Spouse name: N/A  . Number of children: N/A  . Years of education: N/A   Occupational History  . Not on file.   Social History Main Topics  . Smoking status: Never Smoker  . Smokeless tobacco: Never Used  . Alcohol use 4.8 oz/week    8 Glasses of wine per week     Comment: wine , beer  . Drug use: Yes    Types: Marijuana  . Sexual activity: Yes    Partners: Male    Birth control/ protection: None   Other Topics Concern  . Not on file   Social History Narrative  . No narrative on file    ROS:  Pertinent items are noted in HPI.  PHYSICAL EXAMINATION:    BP 122/80 (BP Location: Right Arm, Patient Position: Sitting, Cuff Size: Normal)   Pulse 76   Ht 5' 3.5" (1.613 m)   Wt 117 lb (53.1 kg)   LMP 06/18/2016   BMI 20.40 kg/m     General appearance: alert, cooperative and appears stated age Head: Normocephalic, without obvious  abnormality, atraumatic Neck: no adenopathy, supple, symmetrical, trachea midline and thyroid normal to inspection and palpation Lungs: clear to auscultation bilaterally Breasts:  Bilateral implants, no masses or tenderness, No nipple retraction or dimpling, No nipple discharge or bleeding, No axillary or supraclavicular adenopathy Heart: regular rate and rhythm Abdomen: soft, non-tender, no masses,  no organomegaly Extremities: extremities normal, atraumatic, no cyanosis or edema Skin: Skin color, texture, turgor normal. No rashes or lesions Lymph nodes: Cervical, supraclavicular, and axillary nodes normal. No abnormal inguinal nodes palpated Neurologic: Grossly normal  Pelvic: External genitalia:  no lesions              Urethra:  normal appearing urethra with no masses, tenderness or lesions              Bartholins and Skenes: normal                 Vagina: normal appearing vagina with normal color  and discharge, no lesions              Cervix: no lesions                Bimanual Exam:  Uterus:  6 -7 week size.                Adnexa: no mass, fullness, tenderness on right.  Left adnexal mass 4 cm.                Chaperone was present for exam.  ASSESSMENT  Left ovarian cyst.  Fibroids.  Dysmenorrhea.  Abnormal menses. Stress incontinence.  PLAN  Proceed with total laparoscopic hysterectomy with left oophorectomy, bilateral salpingectomy, possible right oophorectomy, collection of pelvic washings, TVT Exact midurethral sling and cystoscopy.  Risks, benefits, and alternatives have been discussed with the patient who wishes to proceed.  Desires UPT today.  Presurgery instructions reviewed. Questions answered. Will schedule mammogram for her to establish this is [place].   An After Visit Summary was printed and given to the patient.

## 2016-07-12 NOTE — Telephone Encounter (Signed)
Patient's husband, Hilbert Bible (DPR on file to share PHI), called to let Dr. Quincy Simmonds know his wife has Invisiline for teeth straightening with a metal post glued to an upper molar. He wants to be sure Dr. Quincy Simmonds knows this prior to surgery.

## 2016-07-12 NOTE — Telephone Encounter (Signed)
Routing to provider for final review. Patient agreeable to disposition. Will close encounter.     

## 2016-07-12 NOTE — Patient Instructions (Signed)
I will see you the day of surgery! Please contact the office if you need anything.

## 2016-07-16 NOTE — Patient Instructions (Signed)
Your procedure is scheduled on:  Tuesday, Jul 23, 2016  Enter through the Main Entrance of [facility] at:  6:00 AM  Pick up the phone at the desk and dial [phone].  Call this number if you have problems the morning of surgery: [phone].  Remember: Do NOT eat food or drink after:  Midnight Monday  Take these medicines the morning of surgery with a SIP OF WATER:  None  Stop ALL herbal medications, Vitamin E, Omega 3 at this time  Do NOT smoke the day of surgery.  Do NOT wear jewelry (body piercing), metal hair clips/bobby pins, make-up, or nail polish. Do NOT wear lotions, powders, or perfumes.  You may wear deodorant. Do NOT shave for 48 hours prior to surgery. Do NOT bring valuables to the hospital. Contacts, dentures, or bridgework may not be worn into surgery.  Leave suitcase in car.  After surgery it may be brought to your room.  For patients admitted to the hospital, checkout time is 11:00 AM the day of discharge.  Bring a copy of your healthcare power of attorney and living will documents.

## 2016-07-17 ENCOUNTER — Encounter (HOSPITAL_COMMUNITY): Payer: Self-pay

## 2016-07-17 ENCOUNTER — Encounter (HOSPITAL_COMMUNITY)
Admission: RE | Admit: 2016-07-17 | Discharge: 2016-07-17 | Disposition: A | Discharge status: Home / Self Care | Payer: BLUE CROSS/BLUE SHIELD | Source: Ambulatory Visit | Attending: Obstetrics and Gynecology | Admitting: Obstetrics and Gynecology

## 2016-07-17 DIAGNOSIS — D259 Leiomyoma of uterus, unspecified: Secondary | ICD-10-CM | POA: Diagnosis not present

## 2016-07-17 DIAGNOSIS — N83292 Other ovarian cyst, left side: Secondary | ICD-10-CM | POA: Insufficient documentation

## 2016-07-17 DIAGNOSIS — R32 Unspecified urinary incontinence: Secondary | ICD-10-CM | POA: Diagnosis not present

## 2016-07-17 DIAGNOSIS — Z9889 Other specified postprocedural states: Secondary | ICD-10-CM | POA: Diagnosis not present

## 2016-07-17 DIAGNOSIS — Z01812 Encounter for preprocedural laboratory examination: Secondary | ICD-10-CM | POA: Insufficient documentation

## 2016-07-17 DIAGNOSIS — N946 Dysmenorrhea, unspecified: Secondary | ICD-10-CM | POA: Insufficient documentation

## 2016-07-17 HISTORY — DX: Pain in right shoulder: M25.511

## 2016-07-17 HISTORY — DX: Anesthesia of skin: R20.0

## 2016-07-17 HISTORY — DX: Asymptomatic varicose veins of unspecified lower extremity: I83.90

## 2016-07-17 HISTORY — DX: Reserved for inherently not codable concepts without codable children: IMO0001

## 2016-07-17 HISTORY — DX: Encounter for fitting and adjustment of orthodontic device: Z46.4

## 2016-07-17 HISTORY — DX: Paresthesia of skin: R20.2

## 2016-07-17 HISTORY — DX: Unspecified asthma, uncomplicated: J45.909

## 2016-07-17 HISTORY — DX: Anemia, unspecified: D64.9

## 2016-07-17 LAB — BASIC METABOLIC PANEL
ANION GAP: 6 (ref 5–15)
BUN: 12 mg/dL (ref 6–20)
CO2: 29 mmol/L (ref 22–32)
Calcium: 9.8 mg/dL (ref 8.9–10.3)
Chloride: 102 mmol/L (ref 101–111)
Creatinine, Ser: 0.65 mg/dL (ref 0.44–1.00)
GFR calc Af Amer: >60 mL/min (ref >60)
GFR calc non Af Amer: >60 mL/min (ref >60)
GLUCOSE: 92 mg/dL (ref 65–99)
POTASSIUM: 5.3 mmol/L — AB (ref 3.5–5.1)
Sodium: 137 mmol/L (ref 135–145)

## 2016-07-17 LAB — CBC
HEMATOCRIT: 44 % (ref 36.0–46.0)
HEMOGLOBIN: 15.1 g/dL — AB (ref 12.0–15.0)
MCH: 33.1 pg (ref 26.0–34.0)
MCHC: 34.3 g/dL (ref 30.0–36.0)
MCV: 96.5 fL (ref 78.0–100.0)
Platelets: 260 10*3/uL (ref 150–400)
RBC: 4.56 MIL/uL (ref 3.87–5.11)
RDW: 13.7 % (ref 11.5–15.5)
WBC: 8.1 10*3/uL (ref 4.0–10.5)

## 2016-07-17 LAB — TYPE AND SCREEN
ABO/RH(D): A POS
Antibody Screen: NEGATIVE

## 2016-07-17 LAB — ABO/RH: ABO/RH(D): A POS

## 2016-07-17 NOTE — Pre-Procedure Instructions (Signed)
Used Stratus video interpreting [place] [id number]

## 2016-07-18 ENCOUNTER — Telehealth: Payer: Self-pay

## 2016-07-18 NOTE — Telephone Encounter (Signed)
Mucinex, Robitussin, and Claritin are all ok.  To PCP if develops a fever and report this to this office.

## 2016-07-18 NOTE — Telephone Encounter (Signed)
-----   Message from Nunzio Cobbs, MD sent at 07/17/2016  9:38 PM EDT ----- Please report preop labs to patient/husband.   She speaks [place].  Her potassium and hemoglobin were both mildly elevated, which I believe is due to dehydration at the time of her blood work.  Anti-inflammatory medication can also raise potassium levels. No further evaluation is needed at this time. Her blood type is A positive.  I will see her the day of surgery!

## 2016-07-18 NOTE — Telephone Encounter (Signed)
Left message to call Kaitlyn at [phone]. 

## 2016-07-18 NOTE — Telephone Encounter (Signed)
Spoke with [name]. Advised of message as seen below. Crystal Gardner verbalizes understanding and will notify patient.  Routing to provider for final review. Patient agreeable to disposition. Will close encounter.

## 2016-07-18 NOTE — Telephone Encounter (Signed)
Spoke with patient's husband [name], okay per ROI. Advised of results and message as seen below from [place]. [name] verbalizes understanding of results. [name] states that the patient has had a cold for the last few days and has been taking mucinex and cough medication for relief. Reports patient is feeling better today than yesterday. Asking if it is okay to continue these medications or what she can take for a cold before surgery.

## 2016-07-22 NOTE — H&P (Signed)
Office Visit   07/12/2016 [name], [name] All, MD  Obstetrics and Gynecology   Left ovarian cyst +4 more  Dx   Surgery Consult   Reason for Visit   Additional Documentation   Vitals:   BP 122/80 (BP Location: Right Arm, Patient Position: Sitting, Cuff Size: Normal)   Pulse 76   Ht 5' 3.5" (1.613 m)   Wt 117 lb (53.1 kg)   LMP 06/18/2016   BMI 20.40 kg/m   BSA 1.54 m      More Vitals   Flowsheets:   MEWS Score,   Custom Formula Data,   Anthropometrics,   Infectious Disease Screening     Encounter Info:   Billing Info,   History,   Allergies,   Detailed Report     All Notes   Addendum Note by Lowella Fairy, CMA at 07/12/2016 1:00 PM   Author: Lowella Fairy, CMA Author Type: Certified Medical Assistant Filed: 07/12/2016 2:23 PM  Note Status: Signed Cosign: Cosign Not Required Encounter Date: 07/12/2016  Editor: Lowella Fairy, [place] (Certified Medical Assistant)    Addended by: Lowella Fairy on: 07/12/2016 02:23 PM   Modules accepted: Orders     Progress Notes by Nunzio Cobbs, MD at 07/12/2016 1:00 PM   Author: Nunzio Cobbs, MD Author Type: Physician Filed: 07/12/2016 1:56 PM  Note Status: Addendum Cosign: Cosign Not Required Encounter Date: 07/12/2016  Editor: Nunzio Cobbs, MD (Physician)  Prior Versions: 1. Nunzio Cobbs, MD (Physician) at 07/12/2016 1:55 PM - Signed   2. Lowella Fairy, CMA (Certified Medical Assistant) at 07/12/2016 1:25 PM - Sign at close encounter    GYNECOLOGY  VISIT   HPI: 51 y.o.   Married  Caucasian/[place]  female   [place] with Patient's last menstrual period was 06/18/2016.   here for surgical consult.   Planning surgery for left ovarian cyst, fibroids, dysmenorrhea, and stress incontinence.  Full consultation done on 07/03/16, the day of her urodynamics.  Denies dysuria.   2 months with no true menstruation. Some cramping now.     Needs mammogram scheduled.  GYNECOLOGIC HISTORY: Patient's last menstrual period was 06/18/2016. Contraception:  none Menopausal hormone therapy: none Last mammogram: 04/2015 normal per patient in [place] Last pap smear: 10/2015 normal per patient in [place]                OB History    Gravida Para Term Preterm AB Living   1 1 1     1    SAB TAB Ectopic Multiple Live Births                     There are no active problems to display for this patient.       Past Medical History:  Diagnosis Date  . Dysmenorrhea   . Family history not obtainable due to adoption          Past Surgical History:  Procedure Laterality Date  . AUGMENTATION MAMMAPLASTY  10/3265   silicone implants--[place]  . CATARACT EXTRACTION Right 2016   --in [place]  . VARICOSE VEIN SURGERY  2017   in [place]          Current Outpatient Prescriptions  Medication Sig Dispense Refill  . cholecalciferol (VITAMIN D) 1000 units tablet Take 1,000 Units by mouth daily.    . ciprofloxacin (CIPRO) 250 MG tablet Take 1 tablet (  250 mg total) by mouth 2 (two) times daily. 2 tablet 0  . FLAXSEED, LINSEED, PO Take 1 tablet by mouth daily.    [name] Kitchen ibuprofen (ADVIL,MOTRIN) 200 MG tablet Take 200 mg by mouth every 6 (six) hours as needed. Takes 2 tablets daily    . Omega-3 1000 MG CAPS Take 1 capsule by mouth daily.    Marnee Guarneri Isoflavones (SOY BALANCE PO) Take 1 tablet by mouth daily.    [name] Kitchen VITAMIN A PO Take 1 tablet by mouth daily.     No current facility-administered medications for this visit.      ALLERGIES: Patient has no known allergies.       Family History  Problem Relation Age of Onset  . Adopted: Yes    Social History        Social History  . Marital status: Married    Spouse name: N/A  . Number of children: N/A  . Years of education: N/A      Occupational History  . Not on file.         Social History Main Topics  . Smoking status: Never  Smoker  . Smokeless tobacco: Never Used  . Alcohol use 4.8 oz/week    8 Glasses of wine per week     Comment: wine , beer  . Drug use: Yes    Types: Marijuana  . Sexual activity: Yes    Partners: Male    Birth control/ protection: None       Other Topics Concern  . Not on file      Social History Narrative  . No narrative on file    ROS:  Pertinent items are noted in HPI.  PHYSICAL EXAMINATION:    BP 122/80 (BP Location: Right Arm, Patient Position: Sitting, Cuff Size: Normal)   Pulse 76   Ht 5' 3.5" (1.613 m)   Wt 117 lb (53.1 kg)   LMP 06/18/2016   BMI 20.40 kg/m     General appearance: alert, cooperative and appears stated age Head: Normocephalic, without obvious abnormality, atraumatic Neck: no adenopathy, supple, symmetrical, trachea midline and thyroid normal to inspection and palpation Lungs: clear to auscultation bilaterally Breasts:  Bilateral implants, no masses or tenderness, No nipple retraction or dimpling, No nipple discharge or bleeding, No axillary or supraclavicular adenopathy Heart: regular rate and rhythm Abdomen: soft, non-tender, no masses,  no organomegaly Extremities: extremities normal, atraumatic, no cyanosis or edema Skin: Skin color, texture, turgor normal. No rashes or lesions Lymph nodes: Cervical, supraclavicular, and axillary nodes normal. No abnormal inguinal nodes palpated Neurologic: Grossly normal  Pelvic: External genitalia:  no lesions              Urethra:  normal appearing urethra with no masses, tenderness or lesions              Bartholins and Skenes: normal                 Vagina: normal appearing vagina with normal color and discharge, no lesions              Cervix: no lesions                Bimanual Exam:  Uterus:  6 -7 week size.                Adnexa: no mass, fullness, tenderness on right.  Left adnexal mass 4 cm.  Chaperone was present for exam.  ASSESSMENT  Left ovarian cyst.   Fibroids.  Dysmenorrhea.  Abnormal menses. Stress incontinence.  PLAN  Proceed with total laparoscopic hysterectomy with left oophorectomy, bilateral salpingectomy, possible right oophorectomy, collection of pelvic washings, TVT Exact midurethral sling and cystoscopy.  Risks, benefits, and alternatives have been discussed with the patient who wishes to proceed.  Desires UPT today.  Presurgery instructions reviewed. Questions answered. Will schedule mammogram for her to establish this is [place].   An After Visit Summary was printed and given to the patient.

## 2016-07-22 NOTE — Anesthesia Preprocedure Evaluation (Addendum)
Anesthesia Evaluation  Patient identified by MRN, date of birth, ID band Patient awake    Reviewed: Allergy & Precautions, NPO status , Patient's Chart, lab work & pertinent test results  History of Anesthesia Complications Negative for: history of anesthetic complications  Airway Mallampati: I  TM Distance: >3 FB Neck ROM: Full    Dental  (+) Dental Advisory Given, Teeth Intact   Pulmonary asthma , Current Smoker,    breath sounds clear to auscultation       Cardiovascular negative cardio ROS   Rhythm:Regular Rate:Normal     Neuro/Psych negative neurological ROS     GI/Hepatic negative GI ROS, (+)     substance abuse  marijuana use,   Endo/Other  negative endocrine ROS  Renal/GU negative Renal ROS     Musculoskeletal   Abdominal   Peds  Hematology negative hematology ROS (+)   Anesthesia Other Findings   Reproductive/Obstetrics                            Anesthesia Physical Anesthesia Plan  ASA: II  Anesthesia Plan: General   Post-op Pain Management:    Induction: Intravenous  Airway Management Planned: Oral ETT  Additional Equipment:   Intra-op Plan:   Post-operative Plan: Extubation in OR  Informed Consent: I have reviewed the patients History and Physical, chart, labs and discussed the procedure including the risks, benefits and alternatives for the proposed anesthesia with the patient or authorized representative who has indicated his/her understanding and acceptance.   Dental advisory given  Plan Discussed with: CRNA and Surgeon  Anesthesia Plan Comments: (Plan routine monitors, GETA)        Anesthesia Quick Evaluation

## 2016-07-23 ENCOUNTER — Ambulatory Visit (HOSPITAL_COMMUNITY): Payer: BLUE CROSS/BLUE SHIELD | Admitting: Anesthesiology

## 2016-07-23 ENCOUNTER — Encounter (HOSPITAL_COMMUNITY)
Admission: RE | Disposition: A | Discharge status: Home / Self Care | Payer: Self-pay | Source: Ambulatory Visit | Attending: Obstetrics and Gynecology

## 2016-07-23 ENCOUNTER — Encounter (HOSPITAL_COMMUNITY): Payer: Self-pay

## 2016-07-23 ENCOUNTER — Observation Stay (HOSPITAL_COMMUNITY)
Admission: RE | Admit: 2016-07-23 | Discharge: 2016-07-24 | Disposition: A | Discharge status: Home / Self Care | Payer: BLUE CROSS/BLUE SHIELD | Source: Ambulatory Visit | Attending: Obstetrics and Gynecology | Admitting: Obstetrics and Gynecology

## 2016-07-23 DIAGNOSIS — N83202 Unspecified ovarian cyst, left side: Secondary | ICD-10-CM | POA: Diagnosis not present

## 2016-07-23 DIAGNOSIS — D259 Leiomyoma of uterus, unspecified: Secondary | ICD-10-CM | POA: Insufficient documentation

## 2016-07-23 DIAGNOSIS — Z90710 Acquired absence of both cervix and uterus: Secondary | ICD-10-CM | POA: Diagnosis present

## 2016-07-23 DIAGNOSIS — N946 Dysmenorrhea, unspecified: Secondary | ICD-10-CM | POA: Diagnosis not present

## 2016-07-23 DIAGNOSIS — R339 Retention of urine, unspecified: Secondary | ICD-10-CM | POA: Diagnosis not present

## 2016-07-23 DIAGNOSIS — D271 Benign neoplasm of left ovary: Secondary | ICD-10-CM | POA: Insufficient documentation

## 2016-07-23 DIAGNOSIS — N393 Stress incontinence (female) (male): Secondary | ICD-10-CM | POA: Diagnosis not present

## 2016-07-23 DIAGNOSIS — N83292 Other ovarian cyst, left side: Secondary | ICD-10-CM | POA: Diagnosis not present

## 2016-07-23 DIAGNOSIS — F1290 Cannabis use, unspecified, uncomplicated: Secondary | ICD-10-CM | POA: Diagnosis not present

## 2016-07-23 HISTORY — PX: CYSTOSCOPY: SHX5120

## 2016-07-23 HISTORY — PX: BLADDER SUSPENSION: SHX72

## 2016-07-23 HISTORY — PX: TOTAL LAPAROSCOPIC HYSTERECTOMY WITH SALPINGECTOMY: SHX6742

## 2016-07-23 LAB — CBC
HCT: 37.9 % (ref 36.0–46.0)
HEMOGLOBIN: 12.8 g/dL (ref 12.0–15.0)
MCH: 32.2 pg (ref 26.0–34.0)
MCHC: 33.8 g/dL (ref 30.0–36.0)
MCV: 95.2 fL (ref 78.0–100.0)
PLATELETS: 259 10*3/uL (ref 150–400)
RBC: 3.98 MIL/uL (ref 3.87–5.11)
RDW: 13.5 % (ref 11.5–15.5)
WBC: 12.8 10*3/uL — ABNORMAL HIGH (ref 4.0–10.5)

## 2016-07-23 LAB — PREGNANCY, URINE: Preg Test, Ur: NEGATIVE

## 2016-07-23 SURGERY — HYSTERECTOMY, TOTAL, LAPAROSCOPIC, WITH SALPINGECTOMY
Anesthesia: General

## 2016-07-23 MED ORDER — LIDOCAINE-EPINEPHRINE 1 %-1:100000 IJ SOLN
INTRAMUSCULAR | Status: DC | PRN
  Administered 2016-07-23: 2 mL

## 2016-07-23 MED ORDER — KETOROLAC TROMETHAMINE 30 MG/ML IJ SOLN
INTRAMUSCULAR | Status: DC | PRN
  Administered 2016-07-23: 30 mg via INTRAVENOUS

## 2016-07-23 MED ORDER — SCOPOLAMINE 1 MG/3DAYS TD PT72
1.0000 | MEDICATED_PATCH | Freq: Once | TRANSDERMAL | Status: DC
  Administered 2016-07-23: 1.5 mg via TRANSDERMAL

## 2016-07-23 MED ORDER — SUGAMMADEX SODIUM 200 MG/2ML IV SOLN
INTRAVENOUS | Status: AC
  Filled 2016-07-23: qty 2

## 2016-07-23 MED ORDER — CEFOTETAN DISODIUM-DEXTROSE 2-2.08 GM-% IV SOLR
INTRAVENOUS | Status: AC
  Filled 2016-07-23: qty 50

## 2016-07-23 MED ORDER — FENTANYL CITRATE (PF) 250 MCG/5ML IJ SOLN
INTRAMUSCULAR | Status: AC
  Filled 2016-07-23: qty 5

## 2016-07-23 MED ORDER — LACTATED RINGERS IV BOLUS (SEPSIS)
500.0000 mL | Freq: Once | INTRAVENOUS | Status: AC
  Administered 2016-07-23: 500 mL via INTRAVENOUS

## 2016-07-23 MED ORDER — ROPIVACAINE HCL 5 MG/ML IJ SOLN
INTRAMUSCULAR | Status: AC
  Filled 2016-07-23: qty 30

## 2016-07-23 MED ORDER — SCOPOLAMINE 1 MG/3DAYS TD PT72
MEDICATED_PATCH | TRANSDERMAL | Status: AC
  Administered 2016-07-23: 1.5 mg via TRANSDERMAL
  Filled 2016-07-23: qty 1

## 2016-07-23 MED ORDER — OXYCODONE-ACETAMINOPHEN 5-325 MG PO TABS
1.0000 | ORAL_TABLET | ORAL | Status: DC | PRN
  Administered 2016-07-24 (×3): 1 via ORAL
  Filled 2016-07-23 (×3): qty 1

## 2016-07-23 MED ORDER — STERILE WATER FOR IRRIGATION IR SOLN
Status: DC | PRN
  Administered 2016-07-23: 1000 mL via INTRAVESICAL

## 2016-07-23 MED ORDER — DEXAMETHASONE SODIUM PHOSPHATE 10 MG/ML IJ SOLN
INTRAMUSCULAR | Status: DC | PRN
  Administered 2016-07-23: 4 mg via INTRAVENOUS

## 2016-07-23 MED ORDER — MIDAZOLAM HCL 2 MG/2ML IJ SOLN
INTRAMUSCULAR | Status: DC | PRN
  Administered 2016-07-23: 2 mg via INTRAVENOUS

## 2016-07-23 MED ORDER — ONDANSETRON HCL 4 MG/2ML IJ SOLN
INTRAMUSCULAR | Status: AC
  Filled 2016-07-23: qty 2

## 2016-07-23 MED ORDER — SUGAMMADEX SODIUM 200 MG/2ML IV SOLN
INTRAVENOUS | Status: DC | PRN
  Administered 2016-07-23: 100 mg via INTRAVENOUS

## 2016-07-23 MED ORDER — ENOXAPARIN SODIUM 40 MG/0.4ML ~~LOC~~ SOLN
40.0000 mg | SUBCUTANEOUS | Status: AC
  Administered 2016-07-23: 40 mg via SUBCUTANEOUS
  Filled 2016-07-23: qty 0.4

## 2016-07-23 MED ORDER — MENTHOL 3 MG MT LOZG
1.0000 | LOZENGE | OROMUCOSAL | Status: DC | PRN
  Filled 2016-07-23: qty 9

## 2016-07-23 MED ORDER — IBUPROFEN 600 MG PO TABS
600.0000 mg | ORAL_TABLET | Freq: Four times a day (QID) | ORAL | Status: DC | PRN
  Administered 2016-07-24: 600 mg via ORAL
  Filled 2016-07-23: qty 1

## 2016-07-23 MED ORDER — LIDOCAINE-EPINEPHRINE 1 %-1:100000 IJ SOLN
INTRAMUSCULAR | Status: AC
  Filled 2016-07-23: qty 1

## 2016-07-23 MED ORDER — DEXAMETHASONE SODIUM PHOSPHATE 4 MG/ML IJ SOLN
INTRAMUSCULAR | Status: AC
  Filled 2016-07-23: qty 1

## 2016-07-23 MED ORDER — LIDOCAINE HCL (CARDIAC) 20 MG/ML IV SOLN
INTRAVENOUS | Status: DC | PRN
  Administered 2016-07-23: 50 mg via INTRAVENOUS

## 2016-07-23 MED ORDER — MEPERIDINE HCL 25 MG/ML IJ SOLN: 6.2500 mg | INTRAMUSCULAR | Status: DC | PRN

## 2016-07-23 MED ORDER — ONDANSETRON HCL 4 MG/2ML IJ SOLN
INTRAMUSCULAR | Status: DC | PRN
  Administered 2016-07-23: 4 mg via INTRAVENOUS

## 2016-07-23 MED ORDER — HYDROMORPHONE HCL 1 MG/ML IJ SOLN
INTRAMUSCULAR | Status: AC
  Filled 2016-07-23: qty 1

## 2016-07-23 MED ORDER — LIDOCAINE HCL (PF) 1 % IJ SOLN
INTRAMUSCULAR | Status: AC
  Filled 2016-07-23: qty 5

## 2016-07-23 MED ORDER — LACTATED RINGERS IV SOLN
INTRAVENOUS | Status: DC
  Administered 2016-07-23 (×2): via INTRAVENOUS

## 2016-07-23 MED ORDER — LACTATED RINGERS IV SOLN
INTRAVENOUS | Status: DC
  Administered 2016-07-23: 125 mL/h via INTRAVENOUS

## 2016-07-23 MED ORDER — PROMETHAZINE HCL 25 MG/ML IJ SOLN: 6.2500 mg | INTRAMUSCULAR | Status: DC | PRN

## 2016-07-23 MED ORDER — MORPHINE SULFATE (PF) 4 MG/ML IV SOLN
2.0000 mg | INTRAVENOUS | Status: DC | PRN
  Administered 2016-07-23 (×2): 2 mg via INTRAVENOUS
  Filled 2016-07-23 (×2): qty 1

## 2016-07-23 MED ORDER — HYDROMORPHONE HCL 1 MG/ML IJ SOLN: 0.2500 mg | INTRAMUSCULAR | Status: DC | PRN

## 2016-07-23 MED ORDER — BUPIVACAINE HCL (PF) 0.25 % IJ SOLN
INTRAMUSCULAR | Status: AC
  Filled 2016-07-23: qty 30

## 2016-07-23 MED ORDER — MIDAZOLAM HCL 2 MG/2ML IJ SOLN: 0.5000 mg | Freq: Once | INTRAMUSCULAR | Status: DC | PRN

## 2016-07-23 MED ORDER — PROPOFOL 10 MG/ML IV BOLUS
INTRAVENOUS | Status: AC
  Filled 2016-07-23: qty 20

## 2016-07-23 MED ORDER — FENTANYL CITRATE (PF) 100 MCG/2ML IJ SOLN
INTRAMUSCULAR | Status: DC | PRN
Start: 2016-07-23 — End: 2016-07-23
  Administered 2016-07-23 (×5): 50 ug via INTRAVENOUS

## 2016-07-23 MED ORDER — KETOROLAC TROMETHAMINE 30 MG/ML IJ SOLN
INTRAMUSCULAR | Status: AC
  Filled 2016-07-23: qty 1

## 2016-07-23 MED ORDER — LACTATED RINGERS IV SOLN
INTRAVENOUS | Status: DC
  Administered 2016-07-23 – 2016-07-24 (×3): via INTRAVENOUS

## 2016-07-23 MED ORDER — SODIUM CHLORIDE 0.9 % IJ SOLN
INTRAMUSCULAR | Status: AC
  Filled 2016-07-23: qty 20

## 2016-07-23 MED ORDER — ONDANSETRON HCL 4 MG PO TABS: 4.0000 mg | ORAL_TABLET | Freq: Four times a day (QID) | ORAL | Status: DC | PRN

## 2016-07-23 MED ORDER — MIDAZOLAM HCL 2 MG/2ML IJ SOLN
INTRAMUSCULAR | Status: AC
  Filled 2016-07-23: qty 2

## 2016-07-23 MED ORDER — ROCURONIUM BROMIDE 100 MG/10ML IV SOLN
INTRAVENOUS | Status: DC | PRN
  Administered 2016-07-23: 50 mg via INTRAVENOUS
  Administered 2016-07-23 (×3): 10 mg via INTRAVENOUS

## 2016-07-23 MED ORDER — CEFOTETAN DISODIUM-DEXTROSE 2-2.08 GM-% IV SOLR
2.0000 g | INTRAVENOUS | Status: AC
  Administered 2016-07-23: 2 g via INTRAVENOUS

## 2016-07-23 MED ORDER — ONDANSETRON HCL 4 MG/2ML IJ SOLN
4.0000 mg | Freq: Four times a day (QID) | INTRAMUSCULAR | Status: DC | PRN
  Administered 2016-07-23: 4 mg via INTRAVENOUS
  Filled 2016-07-23: qty 2

## 2016-07-23 MED ORDER — PROPOFOL 10 MG/ML IV BOLUS
INTRAVENOUS | Status: DC | PRN
  Administered 2016-07-23: 150 mg via INTRAVENOUS

## 2016-07-23 MED ORDER — KETOROLAC TROMETHAMINE 15 MG/ML IJ SOLN
15.0000 mg | Freq: Four times a day (QID) | INTRAMUSCULAR | Status: DC
  Administered 2016-07-23 – 2016-07-24 (×3): 15 mg via INTRAVENOUS
  Filled 2016-07-23 (×6): qty 1

## 2016-07-23 MED ORDER — BUPIVACAINE HCL (PF) 0.25 % IJ SOLN
INTRAMUSCULAR | Status: DC | PRN
  Administered 2016-07-23: 16 mL

## 2016-07-23 MED ORDER — LACTATED RINGERS IR SOLN
Status: DC | PRN
  Administered 2016-07-23: 3000 mL

## 2016-07-23 MED ORDER — SODIUM CHLORIDE 0.9 % IJ SOLN
INTRAMUSCULAR | Status: AC
  Filled 2016-07-23: qty 10

## 2016-07-23 SURGICAL SUPPLY — 77 items
APPLICATOR ARISTA FLEXITIP XL (MISCELLANEOUS) IMPLANT
BARRIER ADHS 3X4 INTERCEED (GAUZE/BANDAGES/DRESSINGS) IMPLANT
BLADE SURG 11 STRL SS (BLADE) ×4 IMPLANT
BLADE SURG 15 STRL LF C SS BP (BLADE) ×3 IMPLANT
BLADE SURG 15 STRL SS (BLADE) ×1
CABLE HIGH FREQUENCY MONO STRZ (ELECTRODE) ×4 IMPLANT
CANISTER SUCT 3000ML PPV (MISCELLANEOUS) ×4 IMPLANT
CATH FOLEY 2WAY SLVR  5CC 18FR (CATHETERS) ×1
CATH FOLEY 2WAY SLVR 5CC 18FR (CATHETERS) ×3 IMPLANT
CELL SAVER LIPIGURD (MISCELLANEOUS) IMPLANT
CLOTH BEACON ORANGE TIMEOUT ST (SAFETY) ×4 IMPLANT
COVER MAYO STAND STRL (DRAPES) ×4 IMPLANT
COVER TABLE BACK 60X90 (DRAPES) IMPLANT
DECANTER SPIKE VIAL GLASS SM (MISCELLANEOUS) ×12 IMPLANT
DERMABOND ADVANCED (GAUZE/BANDAGES/DRESSINGS) ×1
DERMABOND ADVANCED .7 DNX12 (GAUZE/BANDAGES/DRESSINGS) ×3 IMPLANT
DRSG OPSITE POSTOP 3X4 (GAUZE/BANDAGES/DRESSINGS) ×4 IMPLANT
DURAPREP 26ML APPLICATOR (WOUND CARE) ×4 IMPLANT
EXTRT SYSTEM ALEXIS 14CM (MISCELLANEOUS)
EXTRT SYSTEM ALEXIS 17CM (MISCELLANEOUS)
FORCEPS CUTTING 33CM 5MM (CUTTING FORCEPS) IMPLANT
GAUZE PACKING 2X5 YD STRL (GAUZE/BANDAGES/DRESSINGS) IMPLANT
GLOVE BIO SURGEON STRL SZ 6.5 (GLOVE) ×8 IMPLANT
GLOVE BIOGEL PI IND STRL 7.0 (GLOVE) ×9 IMPLANT
GLOVE BIOGEL PI INDICATOR 7.0 (GLOVE) ×3
GOWN STRL REUS W/TWL LRG LVL3 (GOWN DISPOSABLE) ×16 IMPLANT
HEMOSTAT ARISTA ABSORB 3G PWDR (MISCELLANEOUS) IMPLANT
LIGASURE VESSEL 5MM BLUNT TIP (ELECTROSURGICAL) ×4 IMPLANT
MARKER SKIN DUAL TIP RULER LAB (MISCELLANEOUS) ×4 IMPLANT
NEEDLE HYPO 22GX1.5 SAFETY (NEEDLE) ×8 IMPLANT
NEEDLE INSUFFLATION 120MM (ENDOMECHANICALS) ×4 IMPLANT
NS IRRIG 1000ML POUR BTL (IV SOLUTION) ×4 IMPLANT
OCCLUDER COLPOPNEUMO (BALLOONS) ×4 IMPLANT
PACK LAPAROSCOPY BASIN (CUSTOM PROCEDURE TRAY) ×4 IMPLANT
PACK TRENDGUARD 450 HYBRID PRO (MISCELLANEOUS) IMPLANT
PACK TRENDGUARD 600 HYBRD PROC (MISCELLANEOUS) IMPLANT
PACK VAGINAL WOMENS (CUSTOM PROCEDURE TRAY) ×4 IMPLANT
PAD MAGNETIC INST (MISCELLANEOUS) IMPLANT
POUCH LAPAROSCOPIC INSTRUMENT (MISCELLANEOUS) ×8 IMPLANT
POUCH SPECIMEN RETRIEVAL 10MM (ENDOMECHANICALS) IMPLANT
PROTECTOR NERVE ULNAR (MISCELLANEOUS) ×8 IMPLANT
SCISSORS LAP 5X35 DISP (ENDOMECHANICALS) ×4 IMPLANT
SET CYSTO W/LG BORE CLAMP LF (SET/KITS/TRAYS/PACK) ×4 IMPLANT
SET IRRIG TUBING LAPAROSCOPIC (IRRIGATION / IRRIGATOR) ×4 IMPLANT
SET TRI-LUMEN FLTR TB AIRSEAL (TUBING) ×4 IMPLANT
SLEEVE ADV FIXATION 5X100MM (TROCAR) ×4 IMPLANT
SLING TVT EXACT (Sling) ×4 IMPLANT
SURGIFLO W/THROMBIN 8M KIT (HEMOSTASIS) ×8 IMPLANT
SUT PLAIN 3 0 FS 2 27 (SUTURE) ×4 IMPLANT
SUT VIC AB 0 CT1 27 (SUTURE) ×2
SUT VIC AB 0 CT1 27XBRD ANBCTR (SUTURE) ×6 IMPLANT
SUT VIC AB 2-0 CT2 27 (SUTURE) IMPLANT
SUT VIC AB 2-0 SH 27 (SUTURE) ×1
SUT VIC AB 2-0 SH 27XBRD (SUTURE) ×3 IMPLANT
SUT VICRYL 0 UR6 27IN ABS (SUTURE) IMPLANT
SUT VICRYL 4-0 PS2 18IN ABS (SUTURE) ×4 IMPLANT
SUT VLOC 180 0 9IN  GS21 (SUTURE) ×3
SUT VLOC 180 0 9IN GS21 (SUTURE) ×9 IMPLANT
SYR 50ML LL SCALE MARK (SYRINGE) ×8 IMPLANT
SYRINGE 10CC LL (SYRINGE) ×4 IMPLANT
SYSTEM CARTER THOMASON II (TROCAR) IMPLANT
SYSTEM CONTND EXTRCTN KII BLLN (MISCELLANEOUS) IMPLANT
TIP RUMI ORANGE 6.7MMX12CM (TIP) IMPLANT
TIP UTERINE 5.1X6CM LAV DISP (MISCELLANEOUS) IMPLANT
TIP UTERINE 6.7X10CM GRN DISP (MISCELLANEOUS) IMPLANT
TIP UTERINE 6.7X6CM WHT DISP (MISCELLANEOUS) IMPLANT
TIP UTERINE 6.7X8CM BLUE DISP (MISCELLANEOUS) ×4 IMPLANT
TOWEL OR 17X24 6PK STRL BLUE (TOWEL DISPOSABLE) ×8 IMPLANT
TRAY FOLEY CATH SILVER 14FR (SET/KITS/TRAYS/PACK) ×4 IMPLANT
TRENDGUARD 450 HYBRID PRO PACK (MISCELLANEOUS)
TRENDGUARD 600 HYBRID PROC PK (MISCELLANEOUS)
TROCAR ADV FIXATION 5X100MM (TROCAR) ×4 IMPLANT
TROCAR PORT AIRSEAL 8X120 (TROCAR) ×4 IMPLANT
TROCAR XCEL NON-BLD 11X100MML (ENDOMECHANICALS) IMPLANT
TROCAR XCEL NON-BLD 5MMX100MML (ENDOMECHANICALS) ×4 IMPLANT
TUBING NON-CON 1/4 X 20 CONN (TUBING) ×4 IMPLANT
WARMER LAPAROSCOPE (MISCELLANEOUS) ×4 IMPLANT

## 2016-07-23 NOTE — Addendum Note (Signed)
Addendum  created 07/23/16 2052 by Jonna Munro, CRNA   Sign clinical note

## 2016-07-23 NOTE — Brief Op Note (Signed)
07/23/2016  11:11 AM  PATIENT:  Crystal Gardner  51 y.o. female  PRE-OPERATIVE DIAGNOSIS:  dysmenorrhea, fibroids, complex left ovarian cyst, genuine stress incontinence  POST-OPERATIVE DIAGNOSIS:  dysmenorrhea, fibroids, complex left ovarian cyst, genuine stress incontinence  PROCEDURE:  Procedure(s) with comments: HYSTERECTOMY TOTAL LAPAROSCOPIC WITH SALPINGECTOMY collection of pelvic washings (Bilateral) - 3 hours OR time LAPAROSCOPIC OOPHORECTOMY (Left) TRANSVAGINAL TAPE (TVT) PROCEDURE exact midurethral sling (N/A) CYSTOSCOPY (N/A)  SURGEON:  Surgeon(s) and Role:    * Amundson Raliegh Ip, MD - Primary    * Megan Salon, MD - Assisting  PHYSICIAN ASSISTANT: NA  ASSISTANTS: Megan Salon, MD   ANESTHESIA:   local and general  EBL:  Total I/O In: 1750 [I.V.:1750] Out: 400 [Urine:200; Blood:200]  BLOOD ADMINISTERED:none  DRAINS: Urinary Catheter (Foley)   LOCAL MEDICATIONS USED:  LIDOCAINE   SPECIMEN:  Source of Specimen:  Uterus, cervix, bilateral tubes, right ovary.  Pelvic washings.  DISPOSITION OF SPECIMEN:  PATHOLOGY  COUNTS:  YES  TOURNIQUET:  * No tourniquets in log *  DICTATION: .Note written in [place]: Admit for overnight observation  PATIENT DISPOSITION:  PACU - hemodynamically stable.   Delay start of Pharmacological VTE agent (>24hrs) due to surgical blood loss or risk of bleeding: not applicable

## 2016-07-23 NOTE — Op Note (Signed)
OPERATIVE REPORT   PREOPERATIVE DIAGNOSIS: Dysmenorrhea, uterine fibroids, complex left ovarian cyst, genuine stress incontinence.  POSTOPERATIVE DIAGNOSIS:  Dysmenorrhea, uterine fibroids, complex left ovarian cyst, genuine stress incontinence.  PROCEDURES: Total laparoscopic hysterectomy with bilateral salpingectomy, left oophorectomy, collection of pelvic washings, TVT Exact midurethral sling, and cystoscopy.  SURGEON: Lenard Galloway, M.D.  ASSISTANT: Megan Salon, M.D.  ANESTHESIA: General endotracheal, local with 0.25% Marcaine and Lidocaine 1% with epinephrine 1:100,000.  IVF:  1700 cc.  ESTIMATED BLOOD LOSS:  200 cc.  URINE OUTPUT: 200 cc.   COMPLICATIONS: None.  INDICATIONS FOR THE PROCEDURE:    The patient is a 51 year old G88P1 [place] female who presents with a complex left ovarian cyst, dysmenorrhea, fibroids, and stress incontinence.  She has a minimally elevated CA125 and had consultation with GYN Oncology who stated the risk of malignancy was low.  A plan is made to proceed with a total laparoscopic hysterectomy, bilateral salpingectomy, left oophorectomy, possible right oophorectomy, collection of pelvic washings, and a TVT Exact midurethral sling with cystoscopy.  She declines further medical therapy and future childbearing, and is requesting hysterectomy procedure.   A plan is made to proceed with a total laparoscopic hysterectomy with bilateral salpingectomy, left oophorectomy, collection of pelvic washings, possible right oophorectomy, TVT Exact midurethral sling and cystoscopy after risks, benefits, and alternatives are reviewed.  FINDINGS:    Laparoscopy revealed a normal uterus, bilateral tubes and right ovary.  The left adnexa contained a  4.5 cm paraovarian or left paratubal cyst.  The upper abdomen was normal and demonstrated a normal liver.  There was a minimal adhesion along the right abdominal sidewall near the cecum.  This was not removed.  There was no evidence of endometriosis in the abdomen or pelvis.  Cystoscopy at the termination of the procedure showed the bladder to be normal throughout 360 degrees including the bladder dome and trigone. There was no evidence of any foreign body in the bladder or the urethra. There was no evidence of any lesions of the bladder or the urethra. Both of the ureters were noted to be patent bilaterally prior to and with placement of the midurethral sling.   SPECIMENS:    The uterus, cervix, left ovary, and bilateral tubes were went to pathology separately from the pelvic washings.   DESCRIPTION OF PROCEDURE:   The patient was reidentified in the preoperative hold area.  She did receive  Cefotetan IV for antibiotic prophylaxis. She received Lovenox, TED hose, and PAS stockings for DVT prophylaxis.  In the operating room, the patient was placed in the dorsal lithotomy position on the operating room table. The Trendguard was used. Her legs were placed in the Burdette stirrups and her arms were both tucked at her sides. The patient received general endotracheal anesthesia. The abdomen and vagina were then sterilely prepped, and she was sterilely draped.  A speculum was placed in the vagina and a single-tooth tenaculum was placed on the anterior cervical lip. A figure-of-eight suture of 0 Vicryl was placed on each the anterior and the posterior cervical lips. The uterus was sounded to __8 _cm. The cervix was then dilated with [facility] dilators. A #  __8____ RUMI tip with a KOH ring was placed through the cervix and into the uterine cavity without difficulty. The remaining vaginal instruments were removed. A Foley catheter was placed inside the bladder.  Attention was turned to the abdomen where the umbilical region was injected with 0.25% Marcaine and a small incision created.  A Veress needle was then used to insufflate the abdomen with CO2 gas after a saline drop test was performed and the fluid  flowed freely.  A 5 mm camera port was then placed using the Optiview. 5 mm incisions were then created in the left upper abdomen and the left lower abdomen after the skin was injected locally with 0.25% Marcaine.  The 5 mm trocars were then placed under visualization of the laparoscope.  An 8 mm trocar was placed in the right lower quadrant after injecting with Marcaine and incising with a scalpel.   The patient was placed in Trendelenburg position. An inspection of the abdomen and pelvis was performed. The findings are as noted above.   Pelvic washings were performed and sent to pathology.  The left ureter was identified.  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 The left infundibulopelvic ligament was cauterized and cut with the Ligasure. The left round ligament was then cauterized and divided with same instrument. Dissection was performed to the anterior and posterior leaves of the broad ligaments using the monopolar laparoscopic scissors. The incision was carried across the anterior cul- de-sac along the vesicouterine fold and the bladder was dissected away from the cervix using monopolar cautery scissors. The peritoneum was taken down posteriorly. The left uterine artery was skeletonized at this time using sharp dissection and monopolar cautery.   It was then cauterized and cut with the Ligasure instrument.  Attention was turned to the patient's right-hand side at this time.  The right ureter was identified. The right fallopian tube was grasped and the mesosalpinx cauterized to free it from the right ovary.  The right round ligament and right utero-ovarian ligaments were then cauterized and divided with same instrument. Dissection was performed along the anterior and posterior leaves of the broad ligaments using the monopolar laparoscopic scissors. The incision was carried across the anterior cul- de-sac along the vesicouterine  fold and the bladder was dissected away from the cervix using monopolar cautery scissors. The peritoneum was taken down posteriorly. The right uterine artery was skeletonized at this time using sharp dissection and monopolar cautery.   It was then cauterized and cut with the Ligasure instrument.  The KOH ring was nicely visible. The colpotomy incision was performed with the laparoscopic monopolar scissors in a circumferential fashion. The specimen was then removed from the peritoneal cavity and was sent to Pathology. The balloon occluder was placed in the vagina. The vaginal cuff was sutured at this time using two running suture of 0 V-Loc. The vagina was closed from the patient's right hand side to the left hand side and then back 2 sutures towards the midline for each suture. This provided good full-thickness closure of the vaginal cuff.  The laparoscopic needles for suturing were removed from the peritoneal cavity.  The pelvis was irrigated and suctioned.  The pneumoperitoneal was let down.  There was good hemostasis of the operative sites and pedicles.  Arista was placed over the surgical bed.  The 8 mm trocar was removed and the [employer] instrument was used to close the fascia with a 0/0 Vicryl suture.   The remaining left sided laparoscopic trocars were removed under visualization of the laparoscope. The CO2 pneumoperitoneum was released. The patient received manual breaths to remove any remaining CO2 gas and then the umbilical trocar was removed.  The patient's Foley catheter was removed at this time and cystoscopy was performed and the findings  are as noted above. The Foley catheter was replaced and left to gravity drainage.   Final inspection of the vagina demonstrated good hemostasis of the vaginal cuff.  All skin incisions were closed with subcuticular sutures of 4-0 Vicryl. Dermabond was placed over the incisions.  Allis clamps were used to mark the anterior vaginal wall  from 1 cm below the urethra to a distance of 4 cm below this.  The anterior vaginal wall mucosa was injected locally with 1% lidocaine with epinephrine, 1:100,000.  The vaginal mucosa was then incised vertically in the midline with the scalpel.  With a combination of sharp and blunt dissection, the subvaginal tissue was dissected off the bladder bilaterally.  The dissection was carried back to the pubic rami anteriorly.   The TVT Exact midurethral sling was performed.  The 1 cm suprapubic incisions were created with a scalpel to the right and left of the midline.  The TVT Exact was performed in a bottom-up fashion.  The Foley catheter was removed and the Foley tip with the obturator guide was placed inside the urethra and deflected properly.  The guide was placed through the right retropubic space and then up through the right suprapubic incision.  This was performed without difficulty.  The urethra was deflected in opposite direction and the same was then performed on the patient's left-hand side.  The obturator guide was removed and cystoscopy was performed and the findings were as noted above.  All cystoscopic fluid was drained and the Foley catheter was replaced. The sling was brought up through the suprapubic incisions bilaterally.  A Kelly clamp was placed between the sling and the urethra, and the plastic sheaths were removed.  The sling was trimmed suprapubically. The sling was noted to be in good position,  There was bleeding from the exit sites of the sling on the vaginal side bilaterally.  Surgiflow was placed, and hemostasis was then excellent.  The anterior vaginal wall mucosa was trimmed and then the anterior vaginal wall was closed with a running locked suture of 2-0 Vicryl.  The suprapubic incisions were closed with Dermabond.  The patient was awakened and extubated, and escorted to the recovery room in stable condition.  There were no complications.  All needle, instrument, and sponge  counts were correct.   Lenard Galloway, M.D.

## 2016-07-23 NOTE — Anesthesia Postprocedure Evaluation (Signed)
Anesthesia Post Note  Patient: Crystal Gardner  Procedure(s) Performed: Procedure(s) (LRB): HYSTERECTOMY TOTAL LAPAROSCOPIC WITH SALPINGECTOMY collection of pelvic washings (Bilateral) LAPAROSCOPIC OOPHORECTOMY (Left) TRANSVAGINAL TAPE (TVT) PROCEDURE exact midurethral sling (N/A) CYSTOSCOPY (N/A)  Patient location during evaluation: Women's Unit Anesthesia Type: General Level of consciousness: awake and alert and oriented Pain management: pain level controlled Respiratory status: spontaneous breathing, nonlabored ventilation and respiratory function stable Cardiovascular status: stable Postop Assessment: adequate PO intake and no signs of nausea or vomiting Anesthetic complications: no        Last Vitals:  Vitals:   07/23/16 1759 07/23/16 1940  BP: 128/70 118/73  Pulse: 91 88  Resp: 16 16  Temp: 37.6 C 37.4 C    Last Pain:  Vitals:   07/23/16 1940  TempSrc: Oral  PainSc:    Pain Goal: Patients Stated Pain Goal: 4 (07/23/16 1411)               Willa Rough

## 2016-07-23 NOTE — Anesthesia Postprocedure Evaluation (Signed)
Anesthesia Post Note  Patient: Crystal Gardner  Procedure(s) Performed: Procedure(s) (LRB): HYSTERECTOMY TOTAL LAPAROSCOPIC WITH SALPINGECTOMY collection of pelvic washings (Bilateral) LAPAROSCOPIC OOPHORECTOMY (Left) TRANSVAGINAL TAPE (TVT) PROCEDURE exact midurethral sling (N/A) CYSTOSCOPY (N/A)  Patient location during evaluation: PACU Anesthesia Type: General Level of consciousness: awake and alert, patient cooperative and oriented Pain management: pain level controlled Vital Signs Assessment: post-procedure vital signs reviewed and stable Respiratory status: spontaneous breathing, nonlabored ventilation and respiratory function stable Cardiovascular status: blood pressure returned to baseline and stable Postop Assessment: no signs of nausea or vomiting Anesthetic complications: no        Last Vitals:  Vitals:   07/23/16 1230 07/23/16 1250  BP: (!) 146/92 136/81  Pulse: (!) 59 (!) 59  Resp: 19   Temp: 36.6 C 36.6 C    Last Pain:  Vitals:   07/23/16 1300  TempSrc:   PainSc: 3    Pain Goal: Patients Stated Pain Goal: 3 (07/23/16 1230)               Anneli Bing,E. Khadim Lundberg

## 2016-07-23 NOTE — Progress Notes (Signed)
Day of Surgery Procedure(s) (LRB): HYSTERECTOMY TOTAL LAPAROSCOPIC WITH SALPINGECTOMY collection of pelvic washings (Bilateral) LAPAROSCOPIC OOPHORECTOMY (Left) TRANSVAGINAL TAPE (TVT) PROCEDURE exact midurethral sling (N/A) CYSTOSCOPY (N/A)  Subjective: Patient reports incisional pain.   Just took some morphine. Nausea resolved.   Objective: I have reviewed patient's vital signs, intake and output and labs. Vitals:   07/23/16 1350 07/23/16 1411  BP: (!) 146/73   Pulse: (!) 59   Resp: 18 16  Temp: 98.4 F (36.9 C)      WBC 12.8, Hgb 12.8.   General: cooperative and sleepy.  Appropriate conversation.  Resp: clear to auscultation bilaterally Cardio: regular rate and rhythm, S1, S2 normal, no murmur, click, rub or gallop GI: incision: clean, dry and intact and Scant bowel sounds, soft, nontender.  Extremities: extremities normal, atraumatic, no cyanosis or edema and PAS and Ted hose on.  Dps 2+ bialterally.  Vaginal Bleeding: none  Assessment: s/p Procedure(s) with comments: HYSTERECTOMY TOTAL LAPAROSCOPIC WITH SALPINGECTOMY collection of pelvic washings (Bilateral) - 3 hours OR time LAPAROSCOPIC OOPHORECTOMY (Left) TRANSVAGINAL TAPE (TVT) PROCEDURE exact midurethral sling (N/A) CYSTOSCOPY (N/A): stable  Plan: Advance diet Continue foley due to acute post op state.  Surgical findings and procedure reviewed.   LOS: 0 days    Arloa Koh 07/23/2016, 3:34 PM

## 2016-07-23 NOTE — Anesthesia Procedure Notes (Signed)
Procedure Name: Intubation Date/Time: 07/23/2016 7:38 AM Performed by: Bufford Spikes Pre-anesthesia Checklist: Patient identified, Emergency Drugs available, Suction available and Patient being monitored Patient Re-evaluated:Patient Re-evaluated prior to inductionOxygen Delivery Method: Circle system utilized Preoxygenation: Pre-oxygenation with 100% oxygen Intubation Type: IV induction Ventilation: Mask ventilation without difficulty Laryngoscope Size: Miller and 2 Grade View: Grade I Tube type: Oral Tube size: 7.0 mm Number of attempts: 1 Airway Equipment and Method: Stylet and Oral airway Placement Confirmation: ETT inserted through vocal cords under direct vision,  positive ETCO2 and breath sounds checked- equal and bilateral Secured at: 20 cm Tube secured with: Tape Dental Injury: Teeth and Oropharynx as per pre-operative assessment

## 2016-07-23 NOTE — Transfer of Care (Signed)
Immediate Anesthesia Transfer of Care Note  Patient: Crystal Gardner  Procedure(s) Performed: Procedure(s) with comments: HYSTERECTOMY TOTAL LAPAROSCOPIC WITH SALPINGECTOMY collection of pelvic washings (Bilateral) - 3 hours OR time LAPAROSCOPIC OOPHORECTOMY (Left) TRANSVAGINAL TAPE (TVT) PROCEDURE exact midurethral sling (N/A) CYSTOSCOPY (N/A)  Patient Location: PACU  Anesthesia Type:General  Level of Consciousness: awake and sedated  Airway & Oxygen Therapy: Patient Spontanous Breathing and Patient connected to nasal cannula oxygen  Post-op Assessment: Report given to RN and Post -op Vital signs reviewed and stable  Post vital signs: Reviewed and stable  Last Vitals:  Vitals:   07/23/16 0651  BP: (!) 133/93  Pulse: 84  Resp: 16  Temp: 36.9 C    Last Pain:  Vitals:   07/23/16 0651  TempSrc: Oral      Patients Stated Pain Goal: 3 (45/85/92 9244)  Complications: No apparent anesthesia complications

## 2016-07-23 NOTE — OR Nursing (Signed)
Arista applied in pelvic cavity by Dr. Quincy Simmonds.

## 2016-07-23 NOTE — Progress Notes (Signed)
Update to History of Physical  No marked change in status.  Had some minor URI symptoms.  Feeling well today.   OK to proceed with surgery.

## 2016-07-24 ENCOUNTER — Encounter (HOSPITAL_COMMUNITY): Payer: Self-pay | Admitting: Obstetrics and Gynecology

## 2016-07-24 ENCOUNTER — Encounter: Payer: Self-pay | Admitting: Obstetrics and Gynecology

## 2016-07-24 DIAGNOSIS — N393 Stress incontinence (female) (male): Secondary | ICD-10-CM | POA: Diagnosis not present

## 2016-07-24 LAB — CBC
HEMATOCRIT: 32.7 % — AB (ref 36.0–46.0)
Hemoglobin: 11.2 g/dL — ABNORMAL LOW (ref 12.0–15.0)
MCH: 32.6 pg (ref 26.0–34.0)
MCHC: 34.3 g/dL (ref 30.0–36.0)
MCV: 95.1 fL (ref 78.0–100.0)
Platelets: 234 10*3/uL (ref 150–400)
RBC: 3.44 MIL/uL — ABNORMAL LOW (ref 3.87–5.11)
RDW: 13.8 % (ref 11.5–15.5)
WBC: 10.5 10*3/uL (ref 4.0–10.5)

## 2016-07-24 LAB — BASIC METABOLIC PANEL
ANION GAP: 6 (ref 5–15)
BUN: 6 mg/dL (ref 6–20)
CALCIUM: 8.6 mg/dL — AB (ref 8.9–10.3)
CO2: 29 mmol/L (ref 22–32)
Chloride: 105 mmol/L (ref 101–111)
Creatinine, Ser: 0.68 mg/dL (ref 0.44–1.00)
GFR calc Af Amer: >60 mL/min (ref >60)
GFR calc non Af Amer: >60 mL/min (ref >60)
GLUCOSE: 95 mg/dL (ref 65–99)
Potassium: 3.6 mmol/L (ref 3.5–5.1)
Sodium: 140 mmol/L (ref 135–145)

## 2016-07-24 MED ORDER — ONDANSETRON HCL 4 MG PO TABS: 4.0000 mg | ORAL_TABLET | Freq: Four times a day (QID) | ORAL | 0 refills | Status: DC | PRN

## 2016-07-24 MED ORDER — OXYCODONE-ACETAMINOPHEN 5-325 MG PO TABS: 1.0000 | ORAL_TABLET | ORAL | 0 refills | Status: DC | PRN

## 2016-07-24 MED ORDER — CIPROFLOXACIN HCL 250 MG PO TABS: 250.0000 mg | ORAL_TABLET | Freq: Two times a day (BID) | ORAL | 0 refills | Status: DC

## 2016-07-24 MED ORDER — IBUPROFEN 600 MG PO TABS: 600.0000 mg | ORAL_TABLET | Freq: Four times a day (QID) | ORAL | 0 refills | Status: DC | PRN

## 2016-07-24 NOTE — Progress Notes (Signed)
Pt had no urinary output in catheter from 00:00 to 03:00. Dr. Phineas Real notified. Pt bladder scanned. 488 mL urine shown on bladder scan. Told to irrigate catheter with normal saline. Will continue to monitor.

## 2016-07-24 NOTE — Progress Notes (Signed)
1 Day Post-Op Procedure(s) (LRB): HYSTERECTOMY TOTAL LAPAROSCOPIC WITH SALPINGECTOMY collection of pelvic washings (Bilateral) LAPAROSCOPIC OOPHORECTOMY (Left) TRANSVAGINAL TAPE (TVT) PROCEDURE exact midurethral sling (N/A) CYSTOSCOPY (N/A)  Subjective: Patient reports nausea and + flatus.   Used morphine last night.   Objective: I have reviewed patient's vital signs, intake and output and labs. Vitals:   07/23/16 2348 07/24/16 0410  BP: 118/66 102/63  Pulse: 79 68  Resp: 16 16  Temp: 98.7 F (37.1 C) 98.7 F (37.1 C)   CBC    Component Value Date/Time   WBC 10.5 07/24/2016 0530   RBC 3.44 (L) 07/24/2016 0530   HGB 11.2 (L) 07/24/2016 0530   HCT 32.7 (L) 07/24/2016 0530   PLT 234 07/24/2016 0530   MCV 95.1 07/24/2016 0530   MCH 32.6 07/24/2016 0530   MCHC 34.3 07/24/2016 0530   RDW 13.8 07/24/2016 5885   basic metabolic panel    General: alert and cooperative Resp: clear to auscultation bilaterally Cardio: regular rate and rhythm, S1, S2 normal, no murmur, click, rub or gallop GI: soft, non-tender; bowel sounds normal; no masses,  no organomegaly and incision: clean, dry and intact Extremities: Ted hose on.  Vaginal Bleeding: minimal  Assessment: s/p Procedure(s) with comments: HYSTERECTOMY TOTAL LAPAROSCOPIC WITH SALPINGECTOMY collection of pelvic washings (Bilateral) - 3 hours OR time LAPAROSCOPIC OOPHORECTOMY (Left) TRANSVAGINAL TAPE (TVT) PROCEDURE exact midurethral sling (N/A) CYSTOSCOPY (N/A): progressing well  Plan: Advance diet Encourage ambulation Advance to PO medication Discontinue IV fluids Discharge home Voiding trials prior to discharge.   Discharge instructions given.  Percocet and motrin for pain.  Follow up in about 1 week.    LOS: 1 day    Crystal Gardner 07/24/2016, 8:11 AM

## 2016-07-24 NOTE — Progress Notes (Signed)
The patient will like for her husband to order her food. [place] [employer].

## 2016-07-24 NOTE — Discharge Instructions (Signed)
Total Laparoscopic Hysterectomy, Care After Refer to this sheet in the next few weeks. These instructions provide you with information on caring for yourself after your procedure. Your health care provider may also give you more specific instructions. Your treatment has been planned according to current medical practices, but problems sometimes occur. Call your health care provider if you have any problems or questions after your procedure. What can I expect after the procedure?  Pain and bruising at the incision sites. You will be given pain medicine to control it.  Menopausal symptoms such as hot flashes, night sweats, and insomnia if your ovaries were removed.  Sore throat from the breathing tube that was inserted during surgery. Follow these instructions at home:  Only take over-the-counter or prescription medicines for pain, discomfort, or fever as directed by your health care provider.  Do not take aspirin. It can cause bleeding.  Do not drive when taking pain medicine.  Follow your health care provider's advice regarding diet, exercise, lifting, driving, and general activities.  Resume your usual diet as directed and allowed.  Get plenty of rest and sleep.  Do not douche, use tampons, or have sexual intercourse for at least 6 weeks, or until your health care provider gives you permission.  Change your bandages (dressings) as directed by your health care provider.  Monitor your temperature and notify your health care provider of a fever.  Take showers instead of baths for 2-3 weeks.  Do not drink alcohol until your health care provider gives you permission.  If you develop constipation, you may take a mild laxative with your health care provider's permission. Bran foods may help with constipation problems. Drinking enough fluids to keep your urine clear or pale yellow may help as well.  Try to have someone home with you for 1-2 weeks to help around the house.  Keep all of  your follow-up appointments as directed by your health care provider. Contact a health care provider if:  You have swelling, redness, or increasing pain around your incision sites.  You have pus coming from your incision.  You notice a bad smell coming from your incision.  Your incision breaks open.  You feel dizzy or lightheaded.  You have pain or bleeding when you urinate.  You have persistent diarrhea.  You have persistent nausea and vomiting.  You have abnormal vaginal discharge.  You have a rash.  You have any type of abnormal reaction or develop an allergy to your medicine.  You have poor pain control with your prescribed medicine. Get help right away if:  You have chest pain or shortness of breath.  You have severe abdominal pain that is not relieved with pain medicine.  You have pain or swelling in your legs. This information is not intended to replace advice given to you by your health care provider. Make sure you discuss any questions you have with your health care provider. Document Released: 12/02/2012 Document Revised: 07/20/2015 Document Reviewed: 09/01/2012 Elsevier Interactive Patient Education  2017 [employer]  Urethral Vaginal Sling, Care After Refer to this sheet in the next few weeks. These instructions provide you with information on caring for yourself after your procedure. Your health care provider may also give you more specific instructions. Your treatment has been planned according to current medical practices, but problems sometimes occur. Call your health care provider if you have any problems or questions after your procedure. What can I expect after the procedure? After your procedure, it is typical to  have the following:  A catheter in your bladder until your bladder is able to work on its own properly. You will be instructed on how to empty the catheter bag.  Absorbable stitches in your incisions. They will slowly dissolve over 1-2  months. Follow these instructions at home:  Get plenty of rest.  Only take over-the-counter or prescription medicines as directed by your health care provider. Do not take aspirin because it can cause bleeding.  Do not take baths. Take showers until your health care provider tells you otherwise.  You may resume your usual diet. Eat a well-balanced diet.  Drink enough fluids to keep your urine clear or pale yellow.  Limit exercise and activities as directed by your health care provider. Do not lift anything heavier than 5 pounds (2.3 kg).  Do not douche, use tampons, or have sexual intercourse for 6 weeks after your procedure.  Follow up with your health care provider as directed. Contact a health care provider if:  You have a heavy or bad smelling vaginal discharge.  You have a rash.  You have pain that is not controlled with medicines.  You have lightheadedness or feel faint. Get help right away if:  You have a fever.  You have vaginal bleeding.  You faint.  You have shortness of breath.  You have chest, abdominal, or leg pain.  You have pain when urinating or cannot urinate.  Your catheter is still in your bladder and becomes blocked.  You have swelling, redness, and pain in the vaginal area. This information is not intended to replace advice given to you by your health care provider. Make sure you discuss any questions you have with your health care provider. Document Released: 12/02/2012 Document Revised: 07/20/2015 Document Reviewed: 07/31/2012 Elsevier Interactive Patient Education  2017 [employer].

## 2016-07-24 NOTE — Progress Notes (Signed)
Voiding trial  2 void attempts so far: Void of 100 cc/bladder US 448 cc/PVR 400 cc. Void of 200 cc/bladder US 372 cc/PVR 500 cc.  Discussed with nurse by phone.  Will have patient be discharged to home with foley and leg bag.  Ciprofloxacin 250 mg po bid until cath removed.  Has appointment for 5 days from now in office.  Patient will be instructed in care of the foley cath.

## 2016-07-24 NOTE — Progress Notes (Signed)
Extensive foley care education to patient and significant other. Patient verbalizes understanding and significant other is able to repeat process back to RN. Discharge instructions reviewed with the patient and her spouse, follow-up appointment and medications reviewed and both verbalize understanding.

## 2016-07-24 NOTE — Progress Notes (Signed)
Irrigated catheter with 40 mL of normal saline. Drainage of urine noted in catheter collector bag. 200 mL drained within last 2 minutes.

## 2016-07-27 ENCOUNTER — Telehealth: Payer: Self-pay | Admitting: Obstetrics and Gynecology

## 2016-07-27 NOTE — Telephone Encounter (Signed)
Phone call returned to patient during the weekend.  Husband calling stating wife had significant diarrhea post op starting today.  I spoke with the patient and she states this is the first BM since her surgery.  Diarrhea has now stopped but she feels a little shaky.  She did a Mg Citrate bowel prep pre-op.  Denies fever, nausea, vomiting, or blood in the stool.  Her only abdominal pain is in the right lower abdominal incision, which is the largest incision and the one closed with a fascial suture.  Minimal vaginal bleeding.   Pathology report showing benign left ovary and adenomyosis.  She will hydrate well with Gatorade, water, and eat bananas/rice/toast.  She will call back if significant diarrhea returns, or has any of the above symptoms occur or increase.  Keep appointment for office visit in 2 days.

## 2016-07-27 NOTE — Discharge Summary (Signed)
Physician Discharge Summary  Patient ID: Crystal Gardner MRN: [id number] DOB/AGE: [birth year] 51 y.o.  Admit date: 07/23/2016 Discharge date:  07/24/16  Admission Diagnoses: 1.  Dysmenorrhea 2.  Fibroids 3.  Complex left adnexal mass 4.  Genuine stress incontinence  Discharge Diagnoses:  1.  Dysmenorrhea 2.  Fibroids 3.  Complex left adnexal mass 4.  Genuine stress incontinence 5.  Total laparoscopic hysterectomy with bilateral salpingectomy, left oophorectomy, collection of pelvic washings, TVT Exact midurethral sling, and cystoscopy 6.  Post op urinary retention  Active Problems:   Status post laparoscopic hysterectomy   Discharged Condition: good  Hospital Course: The patient was admitted on 07/23/16 for a total laparoscopic hysterectomy with bilateral salpingectomy, left oophorectomy, collection of pelvic washings, TVT Exact midurethral sling, and cystoscopy. which were performed without complication while under general anesthesia.  She received a morphine and Toradol for pain control initially.  The patient experienced headache and nausea with the morphine.  She had borderline urine output on the first evening post op, so she received a 500 cc LR bolus.  Her urine output then improved.  Her pain medication was converted over to Percocet and Motrin on post op day one when she began taking po well.  She ambulated independently and wore PAS and Ted hose for DVT prophylaxis while in bed.  She receive Lovenox preop as well. Her foley catheter were removed on post op day one, and she had post void residuals of 400 - 500 cc, so her Foley catheter was replaced inside the bladder at the time of her discharge. The patient's vital signs remained stable and she demonstrated no signs of infection during her hospitalization.  The patient's post op day one Hgb was 11.2 .  She was tolerating the this well.  She had very minimal vaginal bleeding, and her incision(s) demonstrated no signs of  erythema or significant drainage.  She was found to be in good condition and ready for discharge on post op day one.  Consults: None  Significant Diagnostic Studies: labs:  See Hospital Course.  Treatments: surgery:  Total laparoscopic hysterectomy with bilateral salpingectomy, left oophorectomy, collection of pelvic washings, TVT Exact midurethral sling, and cystoscopy.  Discharge Exam: Blood pressure 110/72, pulse 78, temperature 98 F (36.7 C), temperature source Oral, resp. rate 16, height 5\' 4"  (1.626 m), weight 116 lb 2 oz (52.7 kg), SpO2 98 %. General: alert and cooperative Resp: clear to auscultation bilaterally Cardio: regular rate and rhythm, S1, S2 normal, no murmur, click, rub or gallop GI: soft, non-tender; bowel sounds normal; no masses,  no organomegaly and incision: clean, dry and intact Extremities: Ted hose on.  Vaginal Bleeding: minimal   Disposition: 01-Home or Self Care  Discharge instructions were reviewed in verbal and written form.   Allergies as of 07/24/2016   No Known Allergies     Medication List    TAKE these medications   CALCIUM PO Take 1 tablet by mouth daily.   ciprofloxacin 250 MG tablet Commonly known as:  CIPRO Take 1 tablet (250 mg total) by mouth 2 (two) times daily.   hydroxypropyl methylcellulose / hypromellose 2.5 % ophthalmic solution Commonly known as:  ISOPTO TEARS / GONIOVISC Place 2 drops into both eyes as needed for dry eyes.   ibuprofen 600 MG tablet Commonly known as:  ADVIL,MOTRIN Take 1 tablet (600 mg total) by mouth every 6 (six) hours as needed (mild pain). What changed:  medication strength  how much to take  when to  take this  reasons to take this  additional instructions   Omega-3 500 MG Caps Take 1,000 mg by mouth daily after breakfast.   ondansetron 4 MG tablet Commonly known as:  ZOFRAN Take 1 tablet (4 mg total) by mouth every 6 (six) hours as needed for nausea.   oxyCODONE-acetaminophen 5-325 MG  tablet Commonly known as:  PERCOCET/ROXICET Take 1-2 tablets by mouth every 4 (four) hours as needed for severe pain (moderate to severe pain (when tolerating fluids)).   VITAMIN E PO Take 1 tablet by mouth daily. 300 units      She will follow up in the office in 5 days.  Signed: Arloa Koh 07/27/2016, 9:52 AM

## 2016-07-29 ENCOUNTER — Encounter: Payer: Self-pay | Admitting: Obstetrics and Gynecology

## 2016-07-29 ENCOUNTER — Ambulatory Visit (INDEPENDENT_AMBULATORY_CARE_PROVIDER_SITE_OTHER): Payer: BLUE CROSS/BLUE SHIELD | Admitting: Obstetrics and Gynecology

## 2016-07-29 VITALS — BP 100/70 | HR 100 | Temp 98.0°F | Ht 63.5 in | Wt 114.2 lb

## 2016-07-29 DIAGNOSIS — Z9889 Other specified postprocedural states: Secondary | ICD-10-CM

## 2016-07-29 NOTE — Progress Notes (Signed)
GYNECOLOGY  VISIT   HPI: 51 y.o.   Married  Caucasian/[place]  female   [facility] Patient's last menstrual period was 06/18/2016 (exact date).   here for 1 week follow up [place] SALPINGECTOMY collection of pelvic washings (Bilateral) LAPAROSCOPIC OOPHORECTOMY (Left ) TRANSVAGINAL TAPE (TVT) PROCEDURE exactmidurethral sling (N/A )CYSTOSCOPY (N/A ). Had urinary retention and was discharged to home with a Foley catheter.   Had diarrhea during the weekend.  This was first BM following surgery.  She did a Mg Citrate prep preop.  Some right sided incisional pain and right flank pain with activity.  Taking Percocet and Motrin.    GYNECOLOGIC HISTORY: Patient's last menstrual period was 06/18/2016 (exact date). Contraception:  Hysterectomy Menopausal hormone therapy:  none Last mammogram:  04/2015 normal per patient in [place]--has appt. 08-07-16 Last pap smear: 10/2015 normal per patient in [place]         OB History    Gravida Para Term Preterm AB Living   1 1 1     1    SAB TAB Ectopic Multiple Live Births                     Patient Active Problem List   Diagnosis Date Noted  . Status post laparoscopic hysterectomy 07/23/2016    Past Medical History:  Diagnosis Date  . Anemia    childhood up until age 70 years old  . Asthma    up until 51 years old  . Dysmenorrhea   . Family history not obtainable due to adoption   . Numbness and tingling of both feet   . Orthodontics    Invisalign, has metal post upper right side of teeth, temporary bridge right side, permanent bridge left side  . Right shoulder pain   . Varicose vein of leg     Past Surgical History:  Procedure Laterality Date  . AUGMENTATION MAMMAPLASTY  [id number]/6948   silicone implants--[place]  . BLADDER SUSPENSION N/A 07/23/2016   Procedure: TRANSVAGINAL TAPE (TVT) PROCEDURE exact midurethral sling;  Surgeon: Nunzio Cobbs, MD;  Location: [facility];  Service: Gynecology;   Laterality: N/A;  . CATARACT EXTRACTION Right 2016   --in [place]  . CESAREAN SECTION    . CYSTOSCOPY N/A 07/23/2016   Procedure: CYSTOSCOPY;  Surgeon: Nunzio Cobbs, MD;  Location: [facility];  Service: Gynecology;  Laterality: N/A;  . TOTAL LAPAROSCOPIC HYSTERECTOMY WITH SALPINGECTOMY Bilateral 07/23/2016   Procedure: HYSTERECTOMY TOTAL LAPAROSCOPIC WITH SALPINGECTOMY collection of pelvic washings;  Surgeon: Nunzio Cobbs, MD;  Location: [facility];  Service: Gynecology;  Laterality: Bilateral;  3 hours OR time  . VARICOSE VEIN SURGERY  2017   in [place]    Current Outpatient Prescriptions  Medication Sig Dispense Refill  . CALCIUM PO Take 1 tablet by mouth daily.    . ciprofloxacin (CIPRO) 250 MG tablet Take 1 tablet (250 mg total) by mouth 2 (two) times daily. 14 tablet 0  . hydroxypropyl methylcellulose / hypromellose (ISOPTO TEARS / GONIOVISC) 2.5 % ophthalmic solution Place 2 drops into both eyes as needed for dry eyes.    [name] Kitchen ibuprofen (ADVIL,MOTRIN) 600 MG tablet Take 1 tablet (600 mg total) by mouth every 6 (six) hours as needed (mild pain). 30 tablet 0  . Krill Oil (OMEGA-3) 500 MG CAPS Take 1,000 mg by mouth daily after breakfast.    . ondansetron (ZOFRAN) 4 MG tablet Take 1 tablet (4 mg total) by mouth  every 6 (six) hours as needed for nausea. 20 tablet 0  . oxyCODONE-acetaminophen (PERCOCET/ROXICET) 5-325 MG tablet Take 1-2 tablets by mouth every 4 (four) hours as needed for severe pain (moderate to severe pain (when tolerating fluids)). 30 tablet 0  . VITAMIN E PO Take 1 tablet by mouth daily. 300 units     No current facility-administered medications for this visit.      ALLERGIES: Patient has no known allergies.  Family History  Problem Relation Age of Onset  . Adopted: Yes    Social History   Social History  . Marital status: Married    Spouse name: N/A  . Number of children: N/A  . Years of education: N/A   Occupational History  . Not on file.    Social History Main Topics  . Smoking status: Never Smoker  . Smokeless tobacco: Never Used  . Alcohol use 4.8 oz/week    8 Glasses of wine per week     Comment: wine , beer  . Drug use: Yes    Types: Marijuana     Comment: last time 07/15/2016, daily  . Sexual activity: Yes    Partners: Male    Birth control/ protection: None, Surgical     Comment: Hyst   Other Topics Concern  . Not on file   Social History Narrative  . No narrative on file    ROS:  Pertinent items are noted in HPI.  PHYSICAL EXAMINATION:    BP 100/70 (BP Location: Left Arm, Patient Position: Sitting, Cuff Size: Normal)   Pulse 100   Temp 98 F (36.7 C)   Ht 5' 3.5" (1.613 m)   Wt 114 lb 3.2 oz (51.8 kg)   LMP 06/18/2016 (Exact Date)   BMI 19.91 kg/m     General appearance: alert, cooperative and appears stated age   Abdomen: incisions intact, soft, non-tender, no masses,  no organomegaly  Pelvic: External genitalia:   Incisions with echymoses.                Bimanual Exam:  Uterus:  Absent.  Sling protected. Cuff intact.  Appropriate tenderness.              Adnexa: no mass, fullness, tenderness         Foley removed, clear and yellow urine noted.   Chaperone was present for exam.  ASSESSMENT  Stable post op.  Diarrhea resolved.  Urinary retention.   PLAN  Foley removed.  Voiding instructions to patient.  Stop Cipro.  Reduce Percocet use. Follow up in 5 weeks.  Call for increasing pain or voiding difficulty. Instructions and activity guidelines to patient.    An After Visit Summary was printed and given to the patient.

## 2016-08-07 ENCOUNTER — Ambulatory Visit
Admission: RE | Admit: 2016-08-07 | Discharge: 2016-08-07 | Disposition: A | Discharge status: Home / Self Care | Payer: BLUE CROSS/BLUE SHIELD | Source: Ambulatory Visit | Attending: Obstetrics and Gynecology | Admitting: Obstetrics and Gynecology

## 2016-08-07 ENCOUNTER — Telehealth: Payer: Self-pay | Admitting: Obstetrics and Gynecology

## 2016-08-07 DIAGNOSIS — Z1231 Encounter for screening mammogram for malignant neoplasm of breast: Secondary | ICD-10-CM

## 2016-08-07 NOTE — Telephone Encounter (Signed)
Dr. Quincy Simmonds, patient had surgery on 07/23/16, ok to proceed with scheduled MMG?     HYSTERECTOMY TOTAL LAPAROSCOPIC WITH SALPINGECTOMY collection of pelvic washings (Bilateral) LAPAROSCOPIC OOPHORECTOMY (Left ) TRANSVAGINAL TAPE (TVT) PROCEDURE exactmidurethral sling (N/A )CYSTOSCOPY (N/A ).

## 2016-08-07 NOTE — Telephone Encounter (Signed)
Patient's spouse called states patient is 12 days post op and is scheduled to have a mammogram today.  He wants to make sure that it is ok for her to still have.

## 2016-08-07 NOTE — Telephone Encounter (Signed)
Reviewed with Dr. Quincy Simmonds, ok to keep MMG appointment as scheduled.  Call to patient, spoke with Spouse, "Crystal Gardner",ok per current dpr, advised as seen above. Thankful for return call and verbalizes understanding.   Routing to provider for final review. Patient is agreeable to disposition. Will close encounter.

## 2016-08-12 ENCOUNTER — Encounter: Payer: Self-pay | Admitting: Obstetrics and Gynecology

## 2016-08-12 ENCOUNTER — Telehealth: Payer: Self-pay | Admitting: Obstetrics and Gynecology

## 2016-08-12 ENCOUNTER — Ambulatory Visit (INDEPENDENT_AMBULATORY_CARE_PROVIDER_SITE_OTHER): Payer: BLUE CROSS/BLUE SHIELD | Admitting: Obstetrics and Gynecology

## 2016-08-12 VITALS — BP 100/68 | HR 76 | Resp 16 | Wt 112.0 lb

## 2016-08-12 DIAGNOSIS — K5900 Constipation, unspecified: Secondary | ICD-10-CM

## 2016-08-12 DIAGNOSIS — R5381 Other malaise: Secondary | ICD-10-CM | POA: Diagnosis not present

## 2016-08-12 DIAGNOSIS — M545 Low back pain, unspecified: Secondary | ICD-10-CM

## 2016-08-12 NOTE — Telephone Encounter (Signed)
Patient's spouse "Crystal Gardner" calling regarding post op problems with his wife. Patient has been constipated since last Thursday and has been self treating with laxatives. Patient is having extreme bloating and has lost her appetite.

## 2016-08-12 NOTE — Telephone Encounter (Signed)
Patient had a [place] on 07/23/2016. [name],patient's spouse, states that the patient began to have increased bloating on 08/08/2016 and could not have a BM or pass gas. Patient used a fleet enema and was able to have a BM. Did not have a BM on 08/09/2016 and woke up on 08/10/2016 with increased bloating and discomfort. On Saturday and Sunday she took Doculax. Last night she was up multiple times having BMs. Reports she is in a lot of pain from bloating and is concerned that she cannot regulate her bowels. Is no longer taking pain medication.  Advised she will need to be seen in the office for further evaluation with Dr.Silva. Appointment scheduled for today at 2:30 pm with Dr.Silva. Husband and patient are agreeable to date and time.  Routing to provider for final review. Patient agreeable to disposition. Will close encounter.

## 2016-08-12 NOTE — Progress Notes (Signed)
GYNECOLOGY  VISIT   HPI: 51 y.o.   Married  [place]  female   [phone] with Patient's last menstrual period was 06/18/2016 (exact date).   here for Right lower quadrant pain and constipation since last Thursday. Patient states that she has taken an enema HYSTERECTOMY TOTAL LAPAROSCOPIC WITH SALPINGECTOMY collection of pelvic washings (Bilateral) LAPAROSCOPIC OOPHORECTOMY (Left ) TRANSVAGINAL TAPE (TVT) PROCEDURE exactmidurethral sling (N/A )CYSTOSCOPY (N/A ).   Feels lower back pain and some right lower quadrant pain.  Constipation. No BM last Thursday, Friday, or Saturday.  Used enema early Friday am.  Has some result but not a lot.  Took two oral Dulcolax on Saturday and felt better overall.  Some nausea but no vomiting.  Eliminating a lot of gas and is feeling better.   No fever of chills.   Is uncomfortable but no real pain.  Not increasing.   No dysuria.  Voiding well.   No vaginal bleeding.   Fell in the bathroom.  Combined wine and pain medication.  No residual issues related to this.  Lots of stress at home.   Wants to go to the beach in about 2 weeks.   GYNECOLOGIC HISTORY: Patient's last menstrual period was 06/18/2016 (exact date). Contraception:  Hysterectomy Menopausal hormone therapy:  none Last mammogram:  08/07/16 BIRADS 1 negative/density c Last pap smear:   10/2015 normal per patient in [place]         OB History    Gravida Para Term Preterm AB Living   1 1 1     1    SAB TAB Ectopic Multiple Live Births                     Patient Active Problem List   Diagnosis Date Noted  . Status post laparoscopic hysterectomy 07/23/2016    Past Medical History:  Diagnosis Date  . Anemia    childhood up until age 23 years old  . Asthma    up until 51 years old  . Dysmenorrhea   . Family history not obtainable due to adoption   . Numbness and tingling of both feet   . Orthodontics    Invisalign, has metal post upper right side of teeth, temporary bridge  right side, permanent bridge left side  . Right shoulder pain   . Varicose vein of leg     Past Surgical History:  Procedure Laterality Date  . AUGMENTATION MAMMAPLASTY  43/3295   silicone implants--[place]  . BLADDER SUSPENSION N/A 07/23/2016   Procedure: TRANSVAGINAL TAPE (TVT) PROCEDURE exact midurethral sling;  Surgeon: Nunzio Cobbs, MD;  Location:  [facility];  Service: Gynecology;  Laterality: N/A;  . CATARACT EXTRACTION Right 2016   --in [place]  . CESAREAN SECTION    . CYSTOSCOPY N/A 07/23/2016   Procedure: CYSTOSCOPY;  Surgeon: Nunzio Cobbs, MD;  Location: [facility];  Service: Gynecology;  Laterality: N/A;  . TOTAL LAPAROSCOPIC HYSTERECTOMY WITH SALPINGECTOMY Bilateral 07/23/2016   Procedure: HYSTERECTOMY TOTAL LAPAROSCOPIC WITH SALPINGECTOMY collection of pelvic washings;  Surgeon: Nunzio Cobbs, MD;  Location: [facility];  Service: Gynecology;  Laterality: Bilateral;  3 hours OR time  . VARICOSE VEIN SURGERY  2017   in [place]    Current Outpatient Prescriptions  Medication Sig Dispense Refill  . Acetaminophen (TYLENOL PO) Take by mouth as needed.    . hydroxypropyl methylcellulose / hypromellose (ISOPTO TEARS / GONIOVISC) 2.5 % ophthalmic solution Place 2 drops into both eyes  as needed for dry eyes.    [name] Kitchen CALCIUM PO Take 1 tablet by mouth daily.    Javier Docker Oil (OMEGA-3) 500 MG CAPS Take 1,000 mg by mouth daily after breakfast.    . ondansetron (ZOFRAN) 4 MG tablet Take 1 tablet (4 mg total) by mouth every 6 (six) hours as needed for nausea. (Patient not taking: Reported on 08/12/2016) 20 tablet 0  . oxyCODONE-acetaminophen (PERCOCET/ROXICET) 5-325 MG tablet Take 1-2 tablets by mouth every 4 (four) hours as needed for severe pain (moderate to severe pain (when tolerating fluids)). (Patient not taking: Reported on 08/12/2016) 30 tablet 0  . VITAMIN E PO Take 1 tablet by mouth daily. 300 units     No current facility-administered medications for this visit.       ALLERGIES: Patient has no known allergies.  Family History  Problem Relation Age of Onset  . Adopted: Yes    Social History   Social History  . Marital status: Married    Spouse name: N/A  . Number of children: N/A  . Years of education: N/A   Occupational History  . Not on file.   Social History Main Topics  . Smoking status: Never Smoker  . Smokeless tobacco: Never Used  . Alcohol use 4.8 oz/week    8 Glasses of wine per week     Comment: wine , beer  . Drug use: Yes    Types: Marijuana     Comment: last time 07/15/2016, daily  . Sexual activity: Yes    Partners: Male    Birth control/ protection: None, Surgical     Comment: Hyst   Other Topics Concern  . Not on file   Social History Narrative  . No narrative on file    ROS:  Pertinent items are noted in HPI.  PHYSICAL EXAMINATION:    BP 100/68 (BP Location: Right Arm, Patient Position: Sitting, Cuff Size: Normal)   Pulse 76   Resp 16   Wt 112 lb (50.8 kg)   LMP 06/18/2016 (Exact Date)   BMI 19.53 kg/m     T 98.4 General appearance: alert, cooperative and appears stated age Head: Normocephalic, without obvious abnormality, atraumatic Abdomen: incisions intact, normal bowel sounds, soft, non-tender, no masses,  no organomegaly Back:  No CVA tenderness. Extremities: extremities normal, atraumatic, no cyanosis or edema Skin: Skin color, texture, turgor normal. No rashes or lesions No abnormal inguinal nodes palpated Neurologic: Grossly normal  Pelvic: External genitalia:  no lesions              Urethra:  normal appearing urethra with no masses, tenderness or lesions              Bartholins and Skenes: normal                 Vagina: normal appearing vagina with normal color and discharge, no lesions              Cervix: absent.  Vaginal cuff sutures intact.                 Bimanual Exam:  Uterus:  Absent.                Adnexa: no mass, fullness, tenderness                Chaperone was  present for exam.  ASSESSMENT  Constipation. Back pain.  No acute abdomen.   No ileus. Some malaise.  I think this is partially  a stress response.   PLAN  We dicussed methods for treating constipation - Colace, Miralax, increasing fiber and water.  I stressed not mixing pain medication with alcohol or with anxiolytics.  She states she understands.  Will check CBC with diff and CMP. Keep 6 week post op check appt.  An After Visit Summary was printed and given to the patient.  __15____ minutes face to face time of which over 50% was spent in counseling.

## 2016-08-12 NOTE — Patient Instructions (Signed)
Try Miralax for constipation.  You can use this daily.  You buy it without a prescription.

## 2016-08-13 LAB — COMPREHENSIVE METABOLIC PANEL
A/G RATIO: 1.7 (ref 1.2–2.2)
ALBUMIN: 4.4 g/dL (ref 3.5–5.5)
ALT: 12 IU/L (ref 0–32)
AST: 16 IU/L (ref 0–40)
Alkaline Phosphatase: 52 IU/L (ref 39–117)
BILIRUBIN TOTAL: 0.3 mg/dL (ref 0.0–1.2)
BUN / CREAT RATIO: 13 (ref 9–23)
BUN: 9 mg/dL (ref 6–24)
CALCIUM: 9.9 mg/dL (ref 8.7–10.2)
CHLORIDE: 100 mmol/L (ref 96–106)
CO2: 24 mmol/L (ref 20–29)
Creatinine, Ser: 0.67 mg/dL (ref 0.57–1.00)
GFR, EST AFRICAN AMERICAN: 117 mL/min/{1.73_m2} (ref >59)
GFR, EST NON AFRICAN AMERICAN: 101 mL/min/{1.73_m2} (ref >59)
Globulin, Total: 2.6 g/dL (ref 1.5–4.5)
Glucose: 92 mg/dL (ref 65–99)
POTASSIUM: 4.8 mmol/L (ref 3.5–5.2)
Sodium: 140 mmol/L (ref 134–144)
TOTAL PROTEIN: 7 g/dL (ref 6.0–8.5)

## 2016-08-13 LAB — CBC WITH DIFFERENTIAL/PLATELET
BASOS: 1 %
Basophils Absolute: 0.1 10*3/uL (ref 0.0–0.2)
EOS (ABSOLUTE): 0.2 10*3/uL (ref 0.0–0.4)
EOS: 3 %
HEMATOCRIT: 38.8 % (ref 34.0–46.6)
HEMOGLOBIN: 13 g/dL (ref 11.1–15.9)
IMMATURE GRANS (ABS): 0 10*3/uL (ref 0.0–0.1)
Immature Granulocytes: 0 %
LYMPHS: 38 %
Lymphocytes Absolute: 2.5 10*3/uL (ref 0.7–3.1)
MCH: 32 pg (ref 26.6–33.0)
MCHC: 33.5 g/dL (ref 31.5–35.7)
MCV: 96 fL (ref 79–97)
MONOCYTES: 7 %
Monocytes Absolute: 0.4 10*3/uL (ref 0.1–0.9)
NEUTROS ABS: 3.3 10*3/uL (ref 1.4–7.0)
Neutrophils: 51 %
PLATELETS: 358 10*3/uL (ref 150–379)
RBC: 4.06 x10E6/uL (ref 3.77–5.28)
RDW: 13.4 % (ref 12.3–15.4)
WBC: 6.5 10*3/uL (ref 3.4–10.8)

## 2016-08-14 ENCOUNTER — Telehealth: Payer: Self-pay

## 2016-08-14 NOTE — Telephone Encounter (Signed)
Needs to wait 8 weeks for any strenuous physical activity or sexual activity.

## 2016-08-14 NOTE — Telephone Encounter (Signed)
-----   Message from Nunzio Cobbs, MD sent at 08/13/2016  4:40 PM EDT ----- Please contact patient (through her husband [name]) and report normal CBC with differential and normal complete metabolic profile.

## 2016-08-14 NOTE — Telephone Encounter (Signed)
Spoke with patient's husband [name], okay per ROI. Advised of results as seen below from [place]. Husband verbalizes understanding and will let the patient know. Asking how often the patient can use Miralax. Advised she may use it once per day. Reports the patient was able to have a BM this morning after taking Miralax last night. Will continue use. Asking is the patient can resume practicing yoga after 1 month or if she needs to wait 6 weeks. Advised will review with Dr.Silva and return call.

## 2016-08-14 NOTE — Telephone Encounter (Signed)
Left message to call Kaitlyn at [phone]. 

## 2016-08-14 NOTE — Telephone Encounter (Signed)
Spoke with patient's husband [name]. Advised of message as seen below from [place]. [name] is agreeable and will notify the patient.  Routing to provider for final review. Patient agreeable to disposition. Will close encounter.

## 2016-08-22 ENCOUNTER — Telehealth: Payer: Self-pay | Admitting: Obstetrics and Gynecology

## 2016-08-22 NOTE — Telephone Encounter (Signed)
Spoke with patients spouse "Crystal Gardner", ok per current dpr. Johns states his wife had surgery on 07/23/16, has increased activity today with gardening, running errands and walked about 6 blocks. Patient experienced some spotting today in underwear and was concerned. Reports BM daily with Mirilax. Denies fever, chills, nausea, vomiting, or odor. Reports she is still having the same pain in abdomen as discussed at [place] dated 08/12/16, has not worsened or changed. Crystal Gardner states they are leaving for [employer], would like to update Dr. Quincy Simmonds. Advised would review with Dr. Quincy Simmonds and return call with recommendations.    Dr. Quincy Simmonds -please review and advise?

## 2016-08-22 NOTE — Telephone Encounter (Signed)
Please made an appointment for me to see the patient tomorrow.  She is doing too much activity for the type of surgery she had.  She is not to carry, lift, pull, or haul over 10 pounds for 6 weeks post op.

## 2016-08-22 NOTE — Telephone Encounter (Signed)
Spoke with spouse, "Crystal Gardner", advised as seen below per Dr. Quincy Simmonds. Patient scheduled for OV on 08/23/16 at 1:30pm with Dr. Quincy Simmonds, declined 9:45am appointment. Crystal Gardner verbalizes understanding and is agreeable. Crystal Gardner ask that Dr. Quincy Simmonds review restrictions again with his wife while in office as she needs to hear from the provider.   Routing to provider for final review. Patient is agreeable to disposition. Will close encounter.

## 2016-08-22 NOTE — Telephone Encounter (Signed)
Patient's spouse [name] calling. Patient is bleeding after surgery.

## 2016-08-23 ENCOUNTER — Ambulatory Visit (INDEPENDENT_AMBULATORY_CARE_PROVIDER_SITE_OTHER): Payer: BLUE CROSS/BLUE SHIELD | Admitting: Obstetrics and Gynecology

## 2016-08-23 ENCOUNTER — Encounter: Payer: Self-pay | Admitting: Obstetrics and Gynecology

## 2016-08-23 VITALS — BP 114/60 | HR 84 | Resp 16 | Wt 115.0 lb

## 2016-08-23 DIAGNOSIS — R8290 Unspecified abnormal findings in urine: Secondary | ICD-10-CM

## 2016-08-23 DIAGNOSIS — R109 Unspecified abdominal pain: Secondary | ICD-10-CM | POA: Diagnosis not present

## 2016-08-23 DIAGNOSIS — Z634 Disappearance and death of family member: Secondary | ICD-10-CM

## 2016-08-23 LAB — POCT URINALYSIS DIPSTICK
BILIRUBIN UA: NEGATIVE
GLUCOSE UA: NEGATIVE
KETONES UA: NEGATIVE
Nitrite, UA: NEGATIVE
Protein, UA: NEGATIVE
Urobilinogen, UA: 0.2 E.U./dL
pH, UA: 6 (ref 5.0–8.0)

## 2016-08-23 NOTE — Progress Notes (Signed)
GYNECOLOGY  VISIT   HPI: 51 y.o.   Married   [place]  female   [phone] with Patient's last menstrual period was 06/18/2016 (exact date).   here for post op bleeding and some abdominal discomfort   No dysuria with voiding.   Has some lower abdominal soreness.  Has multiple appointments yesterday such as opthalmology, and she found it stressful.  Lost her son to suicide about 2 years ago.  He never did counseling.  Several months later her husband lost his daughter in a car accident.  He has gotten help with the loss.  She misses living in [place].  She is in the process of getting a [employer].   She and her husband are going to the beach to try and relax.   GYNECOLOGIC HISTORY: Patient's last menstrual period was 06/18/2016 (exact date). Contraception:  Hysterectomy Menopausal hormone therapy:  none Last mammogram:   08/07/16 BIRADS 1 negative/density c Last pap smear:   10/2015 normal per patient in [place]  Urine: 1+ RBC, 1+ WBC, 6.0pH OB History    Gravida Para Term Preterm AB Living   1 1 1     1    SAB TAB Ectopic Multiple Live Births                     Patient Active Problem List   Diagnosis Date Noted  . Status post laparoscopic hysterectomy 07/23/2016    Past Medical History:  Diagnosis Date  . Anemia    childhood up until age 76 years old  . Asthma    up until 51 years old  . Dysmenorrhea   . Family history not obtainable due to adoption   . Family history of suicide    patient's son.  . Numbness and tingling of both feet   . Orthodontics    Invisalign, has metal post upper right side of teeth, temporary bridge right side, permanent bridge left side  . Right shoulder pain   . Varicose vein of leg     Past Surgical History:  Procedure Laterality Date  . AUGMENTATION MAMMAPLASTY  07/2692   silicone implants--[place]  . BLADDER SUSPENSION N/A 07/23/2016   Procedure: TRANSVAGINAL TAPE (TVT) PROCEDURE exact midurethral sling;  Surgeon: Nunzio Cobbs, MD;  Location: [facility];  Service: Gynecology;  Laterality: N/A;  . CATARACT EXTRACTION Right 2016   --in [place]  . CESAREAN SECTION    . CYSTOSCOPY N/A 07/23/2016   Procedure: CYSTOSCOPY;  Surgeon: Nunzio Cobbs, MD;  Location: [facility];  Service: Gynecology;  Laterality: N/A;  . TOTAL LAPAROSCOPIC HYSTERECTOMY WITH SALPINGECTOMY Bilateral 07/23/2016   Procedure: HYSTERECTOMY TOTAL LAPAROSCOPIC WITH SALPINGECTOMY collection of pelvic washings;  Surgeon: Nunzio Cobbs, MD;  Location: [facility];  Service: Gynecology;  Laterality: Bilateral;  3 hours OR time  . VARICOSE VEIN SURGERY  2017   in [place]    Current Outpatient Prescriptions  Medication Sig Dispense Refill  . Acetaminophen (TYLENOL PO) Take by mouth as needed.    [name] Kitchen CALCIUM PO Take 1 tablet by mouth daily.    . Cholecalciferol (VITAMIN D PO) Take by mouth daily.    . hydroxypropyl methylcellulose / hypromellose (ISOPTO TEARS / GONIOVISC) 2.5 % ophthalmic solution Place 2 drops into both eyes as needed for dry eyes.    Javier Docker Oil (OMEGA-3) 500 MG CAPS Take 1,000 mg by mouth daily after breakfast.    . VITAMIN E PO Take  1 tablet by mouth daily. 300 units     No current facility-administered medications for this visit.      ALLERGIES: Patient has no known allergies.  Family History  Problem Relation Age of Onset  . Adopted: Yes    Social History   Social History  . Marital status: Married    Spouse name: N/A  . Number of children: N/A  . Years of education: N/A   Occupational History  . Not on file.   Social History Main Topics  . Smoking status: Never Smoker  . Smokeless tobacco: Never Used  . Alcohol use 4.8 oz/week    8 Glasses of wine per week     Comment: wine , beer  . Drug use: Yes    Types: Marijuana     Comment: last time 07/15/2016, daily  . Sexual activity: Yes    Partners: Male    Birth control/ protection: None, Surgical     Comment: Hyst   Other Topics Concern  . Not  on file   Social History Narrative  . No narrative on file    ROS:  Pertinent items are noted in HPI.  PHYSICAL EXAMINATION:    BP 114/60 (BP Location: Right Arm, Patient Position: Sitting, Cuff Size: Normal)   Pulse 84   Resp 16   Wt 115 lb (52.2 kg)   LMP 06/18/2016 (Exact Date)   BMI 20.05 kg/m     General appearance: alert, cooperative and appears stated age  Abdomen: incisions intact, soft, non-tender, no masses,  no organomegaly   Pelvic: External genitalia:  no lesions              Urethra:  normal appearing urethra with no masses, tenderness or lesions.  Anterior vaginal suture line intact. Sling protected.               Bartholins and Skenes: normal                 Vagina:  No lesions.  Small amount of mucousy blood.  No evidence of erythema.              Cervix:  Suture line intact at vaginal apex.                 Bimanual Exam:  Uterus:   Absent.               Adnexa: no mass, fullness, tenderness               Chaperone was present for exam.  ASSESSMENT  Status post laparoscopic hysterectomy and TVT midurethral sling.  Mild vaginal bleeding.  No evidence of cuff dehiscence or midurethral sling exposure.  Abdominal discomfort.   Abnormal urine dip.  Bereavement.   Loss of son to suicide.   PLAN  Urine micro and culture. Decrease activity and lifting for one more month.  I have referred patient to a [place] psychologist.  I really encouraged her to call and make an appointment.  Follow up in 2 weeks.    An After Visit Summary was printed and given to the patient.  __15____ minutes face to face time of which over 50% was spent in counseling.

## 2016-08-24 LAB — URINALYSIS, MICROSCOPIC ONLY: Casts: NONE SEEN /lpf

## 2016-08-24 LAB — URINE CULTURE: ORGANISM ID, BACTERIA: NO GROWTH

## 2016-09-02 ENCOUNTER — Ambulatory Visit (INDEPENDENT_AMBULATORY_CARE_PROVIDER_SITE_OTHER): Payer: BLUE CROSS/BLUE SHIELD | Admitting: Obstetrics and Gynecology

## 2016-09-02 ENCOUNTER — Encounter: Payer: Self-pay | Admitting: Obstetrics and Gynecology

## 2016-09-02 VITALS — BP 102/60 | HR 80 | Resp 16 | Wt 115.0 lb

## 2016-09-02 DIAGNOSIS — Z9889 Other specified postprocedural states: Secondary | ICD-10-CM

## 2016-09-02 NOTE — Progress Notes (Signed)
GYNECOLOGY  VISIT   HPI: 51 y.o.   Married  [place]  female   [phone] with Patient's last menstrual period was 06/18/2016 (exact date).   here for  6 week post op.  BMs are normalizing.  Still a little tired with a lot of activity.  Still a little bit of vaginal bleeding if constipated.   Husband present for the visit today.    GYNECOLOGIC HISTORY: Patient's last menstrual period was 06/18/2016 (exact date). Contraception:  Hysterectomy Menopausal hormone therapy:  none Last mammogram:  08/07/16 BIRADS 1 negative/density c Last pap smear:   10/2015 normal per patient in [place]         OB History    Gravida Para Term Preterm AB Living   1 1 1     1    SAB TAB Ectopic Multiple Live Births                     Patient Active Problem List   Diagnosis Date Noted  . Status post laparoscopic hysterectomy 07/23/2016    Past Medical History:  Diagnosis Date  . Anemia    childhood up until age 70 years old  . Asthma    up until 51 years old  . Dysmenorrhea   . Family history not obtainable due to adoption   . Family history of suicide    patient's son.  . Numbness and tingling of both feet   . Orthodontics    Invisalign, has metal post upper right side of teeth, temporary bridge right side, permanent bridge left side  . Right shoulder pain   . Varicose vein of leg     Past Surgical History:  Procedure Laterality Date  . AUGMENTATION MAMMAPLASTY  14/7829   silicone implants--[place]  . BLADDER SUSPENSION N/A 07/23/2016   Procedure: TRANSVAGINAL TAPE (TVT) PROCEDURE exact midurethral sling;  Surgeon: Nunzio Cobbs, MD;  Location: [facility];  Service: Gynecology;  Laterality: N/A;  . CATARACT EXTRACTION Right 2016   --in [place]  . CESAREAN SECTION    . CYSTOSCOPY N/A 07/23/2016   Procedure: CYSTOSCOPY;  Surgeon: Nunzio Cobbs, MD;  Location: [facility];  Service: Gynecology;  Laterality: N/A;  . TOTAL LAPAROSCOPIC HYSTERECTOMY WITH SALPINGECTOMY Bilateral  07/23/2016   Procedure: HYSTERECTOMY TOTAL LAPAROSCOPIC WITH SALPINGECTOMY collection of pelvic washings;  Surgeon: Nunzio Cobbs, MD;  Location: [facility];  Service: Gynecology;  Laterality: Bilateral;  3 hours OR time  . VARICOSE VEIN SURGERY  2017   in [place]    Current Outpatient Prescriptions  Medication Sig Dispense Refill  . Acetaminophen (TYLENOL PO) Take by mouth as needed.    [name] Kitchen CALCIUM PO Take 1 tablet by mouth daily.    . Cholecalciferol (VITAMIN D PO) Take by mouth daily.    . hydroxypropyl methylcellulose / hypromellose (ISOPTO TEARS / GONIOVISC) 2.5 % ophthalmic solution Place 2 drops into both eyes as needed for dry eyes.    Javier Docker Oil (OMEGA-3) 500 MG CAPS Take 1,000 mg by mouth daily after breakfast.    . VITAMIN E PO Take 1 tablet by mouth daily. 300 units     No current facility-administered medications for this visit.      ALLERGIES: Patient has no known allergies.  Family History  Problem Relation Age of Onset  . Adopted: Yes    Social History   Social History  . Marital status: Married    Spouse name: N/A  . Number  of children: N/A  . Years of education: N/A   Occupational History  . Not on file.   Social History Main Topics  . Smoking status: Never Smoker  . Smokeless tobacco: Never Used  . Alcohol use 4.8 oz/week    8 Glasses of wine per week     Comment: wine , beer  . Drug use: Yes    Types: Marijuana     Comment: last time 07/15/2016, daily  . Sexual activity: Yes    Partners: Male    Birth control/ protection: None, Surgical     Comment: Hyst   Other Topics Concern  . Not on file   Social History Narrative  . No narrative on file    ROS:  Pertinent items are noted in HPI.  PHYSICAL EXAMINATION:    BP 102/60 (BP Location: Right Arm, Patient Position: Sitting, Cuff Size: Normal)   Pulse 80   Resp 16   Wt 115 lb (52.2 kg)   LMP 06/18/2016 (Exact Date)   BMI 20.05 kg/m     General appearance: alert, cooperative and  appears stated age   Abdomen: incision well healed, soft, non-tender, no masses,  no organomegaly  Pelvic: External genitalia:  no lesions              Urethra:  normal appearing urethra with no masses, tenderness or lesions. Sling protected.  Suture present on anterior vaginal wall.               Bartholins and Skenes: normal                 Vagina: normal appearing vagina with normal color and discharge, no lesions              Cervix:  Suture not present.                 Bimanual Exam:  Uterus:  Absent.               Adnexa: no mass, fullness, tenderness           Chaperone was present for exam.  ASSESSMENT  Status post laparoscopic hysterectomy with bilateral salpingectomy, left oophorectomy, TVT/cystoscopy.  Doing well post op.   PLAN  Ok to return gradually to normal activity at 8 weeks post op.  Return for annual exam and prn.    An After Visit Summary was printed and given to the patient.

## 2016-09-02 NOTE — Patient Instructions (Signed)
Call the office if you need anything!

## 2016-11-04 ENCOUNTER — Telehealth: Payer: Self-pay | Admitting: Obstetrics and Gynecology

## 2016-11-04 NOTE — Telephone Encounter (Signed)
Agree with recommendations given.  Thanks.  Ok to close encounter.

## 2016-11-04 NOTE — Telephone Encounter (Signed)
Spoke with patients spouse, Crystal Gardner, ok per current dpr. Patient s/p laparoscopic hysterectomy with bilateral salpingectomy, left oophorectomy, TVT/cystoscopy on 07/13/16.   Reports they have been in [place] for 1 month, currently in [place] airport returning home.   Patient reports continued constipation and right sided abdominal pain since surgery. Has been taking Mirilax for constipation relief. Increasing fiber and fluids. Eats small amounts of food and feels bloated, now afraid to eat. LBM 11/04/16, took laxative last night before traveling, has had 3 loose stools.   Reports pain with intercourse, no bleeding. "All pleasure is gone". Recommended OV for further evaluation. Advised spouse Dr. Quincy Simmonds is out of the office until 9/17, can schedule with covering provider, spouse declined. Spouse states patient would like to monitor BMs since returning to the states to update Dr. Quincy Simmonds when she sees her. Spouse declined OV for 9/17, scheduled for 9/18 at 2pm with Dr. Quincy Simmonds. Advised spouse should symptoms worsen, return call to office for earlier appt or seek immediate care at local ER/Urgent care. Spouse verbalizes understanding and is agreeable.  Dr. Sabra Heck  -any additional recommendations?   Cc: Dr. Quincy Simmonds

## 2016-11-04 NOTE — Telephone Encounter (Signed)
Left message to call Christian Borgerding at [phone].  

## 2016-11-04 NOTE — Telephone Encounter (Signed)
Patient spouse called states she had surgery back in May and he feels she is having issues from the surgery and wants to speak to a nurse about it.  States she stays constipated and is in pain when using the bathroom.  Says she bloats after eating anything and that "all her joy and pleasure is gone".

## 2016-11-12 ENCOUNTER — Encounter: Payer: Self-pay | Admitting: Obstetrics and Gynecology

## 2016-11-12 ENCOUNTER — Encounter (INDEPENDENT_AMBULATORY_CARE_PROVIDER_SITE_OTHER): Payer: BLUE CROSS/BLUE SHIELD | Admitting: Obstetrics and Gynecology

## 2016-11-12 DIAGNOSIS — Z0289 Encounter for other administrative examinations: Secondary | ICD-10-CM

## 2016-11-12 NOTE — Progress Notes (Signed)
Patient left without being seen.  Rescheduled for 10/27/16.

## 2016-11-15 ENCOUNTER — Encounter: Payer: Self-pay | Admitting: Obstetrics and Gynecology

## 2016-11-15 ENCOUNTER — Ambulatory Visit (INDEPENDENT_AMBULATORY_CARE_PROVIDER_SITE_OTHER): Payer: BLUE CROSS/BLUE SHIELD | Admitting: Obstetrics and Gynecology

## 2016-11-15 VITALS — BP 100/60 | HR 84 | Ht 63.5 in | Wt 113.6 lb

## 2016-11-15 DIAGNOSIS — R1011 Right upper quadrant pain: Secondary | ICD-10-CM | POA: Diagnosis not present

## 2016-11-15 DIAGNOSIS — N898 Other specified noninflammatory disorders of vagina: Secondary | ICD-10-CM | POA: Diagnosis not present

## 2016-11-15 NOTE — Patient Instructions (Signed)
Consider [employer] [facility] for studying English.

## 2016-11-15 NOTE — Progress Notes (Signed)
GYNECOLOGY  VISIT   HPI: 51 y.o.   Married  Caucasian/[place]ian female   [place] with Patient's last menstrual period was 06/18/2016 (exact date).   here for follow up.    Called office on 11/04/16 for constipation and right sided abdominal pain.  Having pain with intercourse.   Did some reform of her house in [place]. Riding her bike and having some right sided pain below ribs.  No nausea or vomiting.  Dietary change in [place] and had constipation.  Miralax helped.  Had vaginal dryness with sexual activity initially.  Now it is back to normal.  Bladder function is normal.  Feels more irritated when she does not smoke marijuana, but trying to quit.  She has been living in [place] for 9 months now.   GYNECOLOGIC HISTORY: Patient's last menstrual period was 06/18/2016 (exact date). Contraception: Hysterectomy Menopausal hormone therapy:  none Last mammogram:  08/07/16 BIRADS 1 negative/density c Last pap smear: 10/2015 normal per patient in [place]         OB History    Gravida Para Term Preterm AB Living   1 1 1     1    SAB TAB Ectopic Multiple Live Births                     Patient Active Problem List   Diagnosis Date Noted  . Status post laparoscopic hysterectomy 07/23/2016    Past Medical History:  Diagnosis Date  . Anemia    childhood up until age 18 years old  . Asthma    up until 51 years old  . Dysmenorrhea   . Family history not obtainable due to adoption   . Family history of suicide    patient's son.  . Numbness and tingling of both feet   . Orthodontics    Invisalign, has metal post upper right side of teeth, temporary bridge right side, permanent bridge left side  . Right shoulder pain   . Varicose vein of leg     Past Surgical History:  Procedure Laterality Date  . AUGMENTATION MAMMAPLASTY  29/5284   silicone implants--[place]  . BLADDER SUSPENSION N/A 07/23/2016   Procedure: TRANSVAGINAL TAPE (TVT) PROCEDURE exact midurethral sling;  Surgeon:  Nunzio Cobbs, MD;  Location: [facility];  Service: Gynecology;  Laterality: N/A;  . CATARACT EXTRACTION Right 2016   --in [place]  . CESAREAN SECTION    . CYSTOSCOPY N/A 07/23/2016   Procedure: CYSTOSCOPY;  Surgeon: Nunzio Cobbs, MD;  Location: [facility];  Service: Gynecology;  Laterality: N/A;  . TOTAL LAPAROSCOPIC HYSTERECTOMY WITH SALPINGECTOMY Bilateral 07/23/2016   Procedure: HYSTERECTOMY TOTAL LAPAROSCOPIC WITH SALPINGECTOMY collection of pelvic washings;  Surgeon: Nunzio Cobbs, MD;  Location: [facility];  Service: Gynecology;  Laterality: Bilateral;  3 hours OR time  . VARICOSE VEIN SURGERY  2017   in [place]    Current Outpatient Prescriptions  Medication Sig Dispense Refill  . Acetaminophen (TYLENOL PO) Take by mouth as needed.    [name] Kitchen CALCIUM PO Take 1 tablet by mouth daily.    . Cholecalciferol (VITAMIN D PO) Take by mouth daily.    . hydroxypropyl methylcellulose / hypromellose (ISOPTO TEARS / GONIOVISC) 2.5 % ophthalmic solution Place 2 drops into both eyes as needed for dry eyes.    Javier Docker Oil (OMEGA-3) 500 MG CAPS Take 1,000 mg by mouth daily after breakfast.    . VITAMIN E PO Take 1 tablet by mouth  daily. 300 units     No current facility-administered medications for this visit.      ALLERGIES: Patient has no known allergies.  Family History  Problem Relation Age of Onset  . Adopted: Yes  . Hypertension Mother   . Hypertension Father   . Hypertension Maternal Grandmother     Social History   Social History  . Marital status: Married    Spouse name: N/A  . Number of children: N/A  . Years of education: N/A   Occupational History  . Not on file.   Social History Main Topics  . Smoking status: Never Smoker  . Smokeless tobacco: Never Used  . Alcohol use 4.8 oz/week    8 Glasses of wine per week     Comment: wine , beer  . Drug use: Yes    Types: Marijuana  . Sexual activity: Yes    Partners: Male    Birth control/ protection:  None, Surgical     Comment: Hyst   Other Topics Concern  . Not on file   Social History Narrative  . No narrative on file    ROS:  Pertinent items are noted in HPI.  PHYSICAL EXAMINATION:    BP 100/60 (BP Location: Right Arm, Patient Position: Sitting, Cuff Size: Normal)   Pulse 84   Ht 5' 3.5" (1.613 m)   Wt 113 lb 9.6 oz (51.5 kg)   LMP 06/18/2016 (Exact Date)   BMI 19.81 kg/m     General appearance: alert, cooperative and appears stated age   Abdomen: soft, non-tender, no masses,  no organomegaly   Pelvic: External genitalia:  no lesions              Urethra:  normal appearing urethra with no masses, tenderness or lesions.  Sling protected.               Bartholins and Skenes: normal                 Vagina: normal appearing vagina with normal color and discharge, no lesions              Cervix:  Absent.                 Bimanual Exam:  Uterus:   Absent.               Adnexa: no mass, fullness, tenderness        Chaperone was present for exam.  ASSESSMENT  Status post laparoscopic hysterectomy with bilateral salpingectomy, left oophorectomy, TVT/cystoscopy.  Abdominal pain, resolved.  Vaginal dryness.  Improved. Adjustment reaction.   PLAN  Ok to return to all normal activity.  Discussed water based and oil based lubricants.  We reviewed her short time in [place] and the extreme stressed she has had in the last year or so.  She will contact a [place] therapist to do consultation. Follow up for annual exam and prn.    An After Visit Summary was printed and given to the patient.  ____25__ minutes face to face time of which over 50% was spent in counseling.

## 2017-02-06 ENCOUNTER — Telehealth: Payer: Self-pay | Admitting: Obstetrics and Gynecology

## 2017-02-06 NOTE — Telephone Encounter (Signed)
Spoke with patients spouse "Crystal Gardner", requesting OV with Dr. Quincy Simmonds. Advised spouse Dr. Quincy Simmonds is on LOA, can schedule with covering provider.   Reports patient is s/p TLH 06/2016, experiencing continued problems with right sided abdominal pain where incision was. Describes as sometimes burning, inflammation or "like its opening". Still very bloated and uncomfortable. Taking Advil continuously. Spouse request portuguese or spanish interpreter.   Spouse declined OV today, scheduled with Dr. Sabra Heck on 12/14 at 11:30am. Advised Dr. Sabra Heck will review and I will return call with any additional recommendations, spouse is agreeable.   Dr. Sabra Heck- any additional recommendations?

## 2017-02-06 NOTE — Telephone Encounter (Signed)
Patient's spouse [name] calling reporting patient is having burning pain near surgical incision. [name] states Shawntae is complaining that the "incision feels like it is opening".

## 2017-02-06 NOTE — Telephone Encounter (Signed)
No additional recommendations.  I will just need to see her and do an exam.  Thanks.

## 2017-02-07 ENCOUNTER — Telehealth: Payer: Self-pay | Admitting: *Deleted

## 2017-02-07 ENCOUNTER — Ambulatory Visit: Payer: Self-pay | Admitting: Obstetrics & Gynecology

## 2017-02-07 NOTE — Telephone Encounter (Signed)
Spoke with patients spouse "Crystal Gardner", ok per current dpr. Patient scheduled with Crystal Gardner today at 11:30am, patient is adamant about only seeing Crystal Gardner and request to reschedule to a later date. OV rescheduled to 03/10/16 at 2:30pm with Crystal Gardner. ER precautions reviewed with spouse. Crystal Gardner verbalizes understanding and is agreeable.   Routing to provider for final review. Patient is agreeable to disposition. Will close encounter.

## 2017-03-05 NOTE — Progress Notes (Deleted)
GYNECOLOGY  VISIT   HPI: 52 y.o.   Married  Caucasian/[place]ian  female   [place] with Patient's last menstrual period was 06/18/2016 (exact date).   here for     GYNECOLOGIC HISTORY: Patient's last menstrual period was 06/18/2016 (exact date). Contraception:  Hysterectomy Menopausal hormone therapy:  none Last mammogram:  08/07/16 BIRADS 1 negative/density c   Last pap smear:  10/2015 normal per patient in [place]         OB History    Gravida Para Term Preterm AB Living   1 1 1     1    SAB TAB Ectopic Multiple Live Births                     Patient Active Problem List   Diagnosis Date Noted  . Status post laparoscopic hysterectomy 07/23/2016    Past Medical History:  Diagnosis Date  . Anemia    childhood up until age 86 years old  . Asthma    up until 52 years old  . Dysmenorrhea   . Family history not obtainable due to adoption   . Family history of suicide    patient's son.  . Numbness and tingling of both feet   . Orthodontics    Invisalign, has metal post upper right side of teeth, temporary bridge right side, permanent bridge left side  . Right shoulder pain   . Varicose vein of leg     Past Surgical History:  Procedure Laterality Date  . AUGMENTATION MAMMAPLASTY  44/0347   silicone implants--[place]  . BLADDER SUSPENSION N/A 07/23/2016   Procedure: TRANSVAGINAL TAPE (TVT) PROCEDURE exact midurethral sling;  Surgeon: Nunzio Cobbs, MD;  Location: [facility];  Service: Gynecology;  Laterality: N/A;  . CATARACT EXTRACTION Right 2016   --in [place]  . CESAREAN SECTION    . CYSTOSCOPY N/A 07/23/2016   Procedure: CYSTOSCOPY;  Surgeon: Nunzio Cobbs, MD;  Location: [facility];  Service: Gynecology;  Laterality: N/A;  . TOTAL LAPAROSCOPIC HYSTERECTOMY WITH SALPINGECTOMY Bilateral 07/23/2016   Procedure: HYSTERECTOMY TOTAL LAPAROSCOPIC WITH SALPINGECTOMY collection of pelvic washings;  Surgeon: Nunzio Cobbs, MD;  Location: [facility];  Service:  Gynecology;  Laterality: Bilateral;  3 hours OR time  . VARICOSE VEIN SURGERY  2017   in [place]    Current Outpatient Medications  Medication Sig Dispense Refill  . Acetaminophen (TYLENOL PO) Take by mouth as needed.    [name] Kitchen CALCIUM PO Take 1 tablet by mouth daily.    . Cholecalciferol (VITAMIN D PO) Take by mouth daily.    . hydroxypropyl methylcellulose / hypromellose (ISOPTO TEARS / GONIOVISC) 2.5 % ophthalmic solution Place 2 drops into both eyes as needed for dry eyes.    Javier Docker Oil (OMEGA-3) 500 MG CAPS Take 1,000 mg by mouth daily after breakfast.    . VITAMIN E PO Take 1 tablet by mouth daily. 300 units     No current facility-administered medications for this visit.      ALLERGIES: Patient has no known allergies.  Family History  Adopted: Yes  Problem Relation Age of Onset  . Hypertension Mother   . Hypertension Father   . Hypertension Maternal Grandmother     Social History   Socioeconomic History  . Marital status: Married    Spouse name: Not on file  . Number of children: Not on file  . Years of education: Not on file  . Highest education level:  Not on file  Social Needs  . Financial resource strain: Not on file  . Food insecurity - worry: Not on file  . Food insecurity - inability: Not on file  . Transportation needs - medical: Not on file  . Transportation needs - non-medical: Not on file  Occupational History  . Not on file  Tobacco Use  . Smoking status: Never Smoker  . Smokeless tobacco: Never Used  Substance and Sexual Activity  . Alcohol use: Yes    Alcohol/week: 4.8 oz    Types: 8 Glasses of wine per week    Comment: wine , beer  . Drug use: Yes    Types: Marijuana  . Sexual activity: Yes    Partners: Male    Birth control/protection: None, Surgical    Comment: Hyst  Other Topics Concern  . Not on file  Social History Narrative  . Not on file    ROS:  Pertinent items are noted in HPI.  PHYSICAL EXAMINATION:    LMP 06/18/2016 (Exact  Date)     General appearance: alert, cooperative and appears stated age Head: Normocephalic, without obvious abnormality, atraumatic Neck: no adenopathy, supple, symmetrical, trachea midline and thyroid normal to inspection and palpation Lungs: clear to auscultation bilaterally Breasts: normal appearance, no masses or tenderness, No nipple retraction or dimpling, No nipple discharge or bleeding, No axillary or supraclavicular adenopathy Heart: regular rate and rhythm Abdomen: soft, non-tender, no masses,  no organomegaly Extremities: extremities normal, atraumatic, no cyanosis or edema Skin: Skin color, texture, turgor normal. No rashes or lesions Lymph nodes: Cervical, supraclavicular, and axillary nodes normal. No abnormal inguinal nodes palpated Neurologic: Grossly normal  Pelvic: External genitalia:  no lesions              Urethra:  normal appearing urethra with no masses, tenderness or lesions              Bartholins and Skenes: normal                 Vagina: normal appearing vagina with normal color and discharge, no lesions              Cervix: no lesions                Bimanual Exam:  Uterus:  normal size, contour, position, consistency, mobility, non-tender              Adnexa: no mass, fullness, tenderness              Rectal exam: {yes no:314532}.  Confirms.              Anus:  normal sphincter tone, no lesions  Chaperone was present for exam.  ASSESSMENT     PLAN     An After Visit Summary was printed and given to the patient.  ______ minutes face to face time of which over 50% was spent in counseling.

## 2017-03-06 ENCOUNTER — Telehealth: Payer: Self-pay

## 2017-03-06 NOTE — Telephone Encounter (Signed)
Called patient to r/s appt.03-10-17 for abd.pain. Husband [name] answered and per DPR spoke with him to reschedule appointment. Dr.Silva still on LOA and will not be in office 03-10-17 and offered for patient to see another physician. Husband relayed this to patient and patient and husband are leaving for [place] on 03-12-17 because patient's sister passed away. Patient does not want to see another physician--only wants Dr.Silva. She will [place] to [place] around 04[place]02[place]. Made patient an appointment on 04-11-17 to see Dr.Silva. Husband and patient thanked me.

## 2017-03-10 ENCOUNTER — Ambulatory Visit: Payer: Self-pay | Admitting: Obstetrics and Gynecology

## 2017-04-11 ENCOUNTER — Other Ambulatory Visit: Payer: Self-pay

## 2017-04-11 ENCOUNTER — Encounter: Payer: Self-pay | Admitting: Obstetrics and Gynecology

## 2017-04-11 ENCOUNTER — Encounter: Payer: Self-pay | Admitting: Gastroenterology

## 2017-04-11 ENCOUNTER — Ambulatory Visit: Payer: BLUE CROSS/BLUE SHIELD | Admitting: Obstetrics and Gynecology

## 2017-04-11 VITALS — BP 108/60 | HR 92 | Temp 98.2°F | Resp 16 | Wt 115.0 lb

## 2017-04-11 DIAGNOSIS — Z1211 Encounter for screening for malignant neoplasm of colon: Secondary | ICD-10-CM

## 2017-04-11 DIAGNOSIS — R109 Unspecified abdominal pain: Secondary | ICD-10-CM | POA: Diagnosis not present

## 2017-04-11 DIAGNOSIS — R319 Hematuria, unspecified: Secondary | ICD-10-CM

## 2017-04-11 LAB — POCT URINALYSIS DIPSTICK
Bilirubin, UA: NEGATIVE
Glucose, UA: NEGATIVE
Ketones, UA: NEGATIVE
LEUKOCYTES UA: NEGATIVE
NITRITE UA: NEGATIVE
PROTEIN UA: 9
Urobilinogen, UA: 0.2 E.U./dL
pH, UA: 5 (ref 5.0–8.0)

## 2017-04-11 NOTE — Patient Instructions (Signed)
Try colace for stool softening.  You can take this daily.   I am ordering a CT scan of the abdomen and pelvis.   I am making a referral for a gastroenterologist to do a colonoscopy.   We are doing blood tests today to check your blood counts and chemistries.  The the [employer] program at [place] is a great option.
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
## 2017-04-11 NOTE — Progress Notes (Signed)
Scheduled patient while in office for CT scan of abdomen pelvis with [facility] on 04/25/2017 at 4:30 pm. This is the earliest appointment available. Patient is agreeable to date and time. Spoke with husband [name] who is also agreeable and aware they will need to pick up instructions and contrast before the appointment. Referral placed to Dr.Nandigam for colonoscopy as well.

## 2017-04-12 LAB — URINALYSIS, MICROSCOPIC ONLY: Casts: NONE SEEN /lpf

## 2017-04-12 LAB — CBC
HEMATOCRIT: 41.7 % (ref 34.0–46.6)
Hemoglobin: 14 g/dL (ref 11.1–15.9)
MCH: 32.3 pg (ref 26.6–33.0)
MCHC: 33.6 g/dL (ref 31.5–35.7)
MCV: 96 fL (ref 79–97)
PLATELETS: 321 10*3/uL (ref 150–379)
RBC: 4.33 x10E6/uL (ref 3.77–5.28)
RDW: 14.3 % (ref 12.3–15.4)
WBC: 9.9 10*3/uL (ref 3.4–10.8)

## 2017-04-12 LAB — COMPREHENSIVE METABOLIC PANEL
A/G RATIO: 1.9 (ref 1.2–2.2)
ALBUMIN: 5 g/dL (ref 3.5–5.5)
ALK PHOS: 58 IU/L (ref 39–117)
ALT: 23 IU/L (ref 0–32)
AST: 22 IU/L (ref 0–40)
BILIRUBIN TOTAL: 0.4 mg/dL (ref 0.0–1.2)
BUN/Creatinine Ratio: 16 (ref 9–23)
BUN: 11 mg/dL (ref 6–24)
CHLORIDE: 101 mmol/L (ref 96–106)
CO2: 26 mmol/L (ref 20–29)
Calcium: 10.1 mg/dL (ref 8.7–10.2)
Creatinine, Ser: 0.68 mg/dL (ref 0.57–1.00)
GFR calc Af Amer: 115 mL/min/{1.73_m2} (ref >59)
GFR calc non Af Amer: 100 mL/min/{1.73_m2} (ref >59)
GLUCOSE: 81 mg/dL (ref 65–99)
Globulin, Total: 2.6 g/dL (ref 1.5–4.5)
POTASSIUM: 4.6 mmol/L (ref 3.5–5.2)
SODIUM: 146 mmol/L — AB (ref 134–144)
Total Protein: 7.6 g/dL (ref 6.0–8.5)

## 2017-04-13 LAB — URINE CULTURE: ORGANISM ID, BACTERIA: NO GROWTH

## 2017-04-14 ENCOUNTER — Telehealth: Payer: Self-pay | Admitting: *Deleted

## 2017-04-14 NOTE — Telephone Encounter (Signed)
-----   Message from Nunzio Cobbs, MD sent at 04/12/2017 10:33 AM EST ----- Please report results to patient.  Blood chemistries show that her sodium in 2 points above normal.  Otherwise all chemistries are normal including kidneys and liver.  Her blood counts are normal. Her urine micro is normal and the UC is pending.

## 2017-04-14 NOTE — Telephone Encounter (Signed)
Spoke with patients spouse, "[name]", ok per current dpr. Advised of results as seen below per Dr. Quincy Simmonds.   1. Is there any additional f/u for elevated sodium? Any relevance to symptoms patient is experiencing?   2. FYI -patient is scheduled for colonoscopy with Dr. Silverio Decamp on 4/18 for colonoscopy.   Advised will review with Dr. Quincy Simmonds and return call, request detailed message if no answer.   Dr. Quincy Simmonds -please review and advise?

## 2017-04-14 NOTE — Telephone Encounter (Signed)
The elevated sodium was likely due to the patient's diarrhea and vomiting she had the night before her blood was drawn. I do not think the mild sodium elevattion is related to the abdominal pain she is having.

## 2017-04-14 NOTE — Telephone Encounter (Signed)
Returning a call to Crystal Gardner . °

## 2017-04-14 NOTE — Telephone Encounter (Signed)
Left message to call Carlesha Seiple at [phone].  

## 2017-04-14 NOTE — Telephone Encounter (Signed)
Notes recorded by Burnice Logan, RN on 04/14/2017 at 11:29 AM EST Left message to call Sharee Pimple at [phone].

## 2017-04-15 NOTE — Telephone Encounter (Signed)
Spoke with patients spouse, advised of results and recommendations per Dr. Quincy Simmonds. Spouse verbalizes understanding and is agreeable.   Notes recorded by Nunzio Cobbs, MD on 04/14/2017 at 8:01 PM EST Please inform patient of her negative UC result.  Routing to provider for final review. Patient is agreeable to disposition. Will close encounter.

## 2017-04-17 ENCOUNTER — Telehealth: Payer: Self-pay | Admitting: Obstetrics and Gynecology

## 2017-04-17 NOTE — Telephone Encounter (Signed)
I contacted AMES specialist regarding Peer to Peer Review regarding CT of abdomen and pelvis for chronic right sided abdominal pain following hysterectomy. I contacted the appeals department and am now waiting for a call back. Her member ID number is [id number].   Cc- Lerry Liner

## 2017-04-18 ENCOUNTER — Telehealth: Payer: Self-pay | Admitting: Obstetrics and Gynecology

## 2017-04-18 NOTE — Telephone Encounter (Signed)
Received call from [employer]. She advised that our provider has requested a Provider Courtesy Review (PCR Review). Peter Congo advised medical/clinical documentation to support the requested Ct Abdomen Pelvis W Contrast,  will need to be faxed to the attention of Provider Courtesy Review/Gloria at fax number [phone]) [phone]. Peter Congo advised the review will take place on 04/23/17 and we will then be notified of the decision. Gloria's phone number is [phone] extension [phone].   cc: Dr Quincy Simmonds  cc: Glorianne Manchester, RN

## 2017-04-20 NOTE — Telephone Encounter (Signed)
Please send the following documents to the insurance company: 07/23/16 - Operative report 08/12/16 - Office visit 08/23/16 - Office visit 11/15/16 - Office visit 04/11/17 - Office visit  A CT scan of the abdomen and pelvis is indicated at this time.   She has also been referred for a colonoscopy.   [place]

## 2017-04-21 ENCOUNTER — Encounter (HOSPITAL_COMMUNITY): Payer: Self-pay

## 2017-04-21 ENCOUNTER — Ambulatory Visit (HOSPITAL_COMMUNITY)
Admission: EM | Admit: 2017-04-21 | Discharge: 2017-04-21 | Disposition: A | Discharge status: Home / Self Care | Payer: BLUE CROSS/BLUE SHIELD | Attending: Family Medicine | Admitting: Family Medicine

## 2017-04-21 DIAGNOSIS — L03211 Cellulitis of face: Secondary | ICD-10-CM

## 2017-04-21 MED ORDER — CEPHALEXIN 500 MG PO CAPS: 500.0000 mg | ORAL_CAPSULE | Freq: Four times a day (QID) | ORAL | 0 refills | Status: DC

## 2017-04-21 MED ORDER — MUPIROCIN 2 % EX OINT: 1.0000 "application " | TOPICAL_OINTMENT | Freq: Two times a day (BID) | CUTANEOUS | 0 refills | Status: DC

## 2017-04-21 NOTE — Telephone Encounter (Signed)
Reviewed with Rutherford Guys. Confirmed requested information as seen below was faxed to [facility] on 04/18/17.   Routing to Dr. Quincy Simmonds.  Cc: Lerry Liner

## 2017-04-21 NOTE — ED Triage Notes (Signed)
Pt here for abscess that has been present for 3 days in her left nostril and has been putting neosporin in her nose. Has gotten painful and swelling in her left cheek as well. Did take tylenol for the pain to help.

## 2017-04-21 NOTE — Telephone Encounter (Signed)
Will close encounter

## 2017-04-21 NOTE — Telephone Encounter (Signed)
Received call from Peter Congo with [facility] (phone number [phone] extension [phone]) advising the requested CT Abdomen Pelvis w/Contrast has been approved.  The authorization number is [id number] and is valid 04/21/17 through 05/20/17. Peter Congo advised a fax will be forth coming with this information.     Routing to Dr Quincy Simmonds  cc: Glorianne Manchester, RN

## 2017-04-21 NOTE — Telephone Encounter (Signed)
Has appointment scheduled for 04/25/17.  Cc- Lerry Liner

## 2017-04-21 NOTE — Discharge Instructions (Signed)
Start Keflex as directed.  Bactroban on affected area.  Warm compress. She can continue Tylenol for pain.  Follow-up for reevaluation of experiencing spreading redness, increased warmth, fever.

## 2017-04-21 NOTE — ED Provider Notes (Signed)
[facility]    CSN: [id number] Arrival date & time: 04/21/17  1035     History   Chief Complaint Chief Complaint  Patient presents with  . Abscess    HPI Crystal Gardner is a 52 y.o. female.   52 year old female comes in with husband for 3-day history of abscess to the left nostril.  States that this she has had swelling, with pain, and increased warmth.  She has been applying Neosporin with good relief.  Has been taking Tylenol for the pain.  Denies fever, chills, night sweats.      Past Medical History:  Diagnosis Date  . Anemia    childhood up until age 48 years old  . Asthma    up until 52 years old  . Dysmenorrhea   . Family history not obtainable due to adoption   . Family history of suicide    patient's son.  . Numbness and tingling of both feet   . Orthodontics    Invisalign, has metal post upper right side of teeth, temporary bridge right side, permanent bridge left side  . Right shoulder pain   . Varicose vein of leg     There are no active problems to display for this patient.   Past Surgical History:  Procedure Laterality Date  . AUGMENTATION MAMMAPLASTY  46/2703   silicone implants--[place]  . BLADDER SUSPENSION N/A 07/23/2016   Procedure: TRANSVAGINAL TAPE (TVT) PROCEDURE exact midurethral sling;  Surgeon: Nunzio Cobbs, MD;  Location: [facility];  Service: Gynecology;  Laterality: N/A;  . CATARACT EXTRACTION Right 2016   --in [place]  . CESAREAN SECTION    . CYSTOSCOPY N/A 07/23/2016   Procedure: CYSTOSCOPY;  Surgeon: Nunzio Cobbs, MD;  Location: [facility];  Service: Gynecology;  Laterality: N/A;  . TOTAL LAPAROSCOPIC HYSTERECTOMY WITH SALPINGECTOMY Bilateral 07/23/2016   Procedure: HYSTERECTOMY TOTAL LAPAROSCOPIC WITH SALPINGECTOMY collection of pelvic washings;  Surgeon: Nunzio Cobbs, MD;  Location: [facility];  Service: Gynecology;  Laterality: Bilateral;  3 hours OR time  . VARICOSE VEIN SURGERY  2017     in [place]    OB History    Gravida Para Term Preterm AB Living   1 1 1     1    SAB TAB Ectopic Multiple Live Births                   Home Medications    Prior to Admission medications   Medication Sig Start Date End Date Taking? Authorizing Provider  Acetaminophen (TYLENOL PO) Take by mouth as needed.    [provider]  cephALEXin (KEFLEX) 500 MG capsule Take 1 capsule (500 mg total) by mouth 4 (four) times daily. 04/21/17   Tasia Catchings, Amy V, PA-C  Cholecalciferol (VITAMIN D PO) Take by mouth daily.    [provider]  hydroxypropyl methylcellulose / hypromellose (ISOPTO TEARS / GONIOVISC) 2.5 % ophthalmic solution Place 2 drops into both eyes as needed for dry eyes.    [provider]  Astrid Drafts (OMEGA-3) 500 MG CAPS Take 1,000 mg by mouth daily after breakfast.    [provider]  mupirocin ointment (BACTROBAN) 2 % Apply 1 application topically 2 (two) times daily. 04/21/17   Tasia Catchings, Amy V, PA-C  VITAMIN E PO Take 1 tablet by mouth daily. 300 units    [provider]    Family History Family History  Adopted: Yes  Problem Relation Age of Onset  .  Hypertension Mother   . Hypertension Father   . Hypertension Maternal Grandmother     Social History Social History   Tobacco Use  . Smoking status: Never Smoker  . Smokeless tobacco: Never Used  Substance Use Topics  . Alcohol use: Yes    Alcohol/week: 4.8 oz    Types: 8 Glasses of wine per week    Comment: wine , beer  . Drug use: Yes    Types: Marijuana     Allergies   Patient has no known allergies.   Review of Systems Review of Systems  Reason unable to perform ROS: See HPI as above.     Physical Exam Triage Vital Signs ED Triage Vitals  Enc Vitals Group     BP 04/21/17 1135 124/76     Pulse Rate 04/21/17 1135 84     Resp 04/21/17 1135 18     Temp 04/21/17 1135 98.5 F (36.9 C)     Temp Source 04/21/17 1135 Oral     SpO2 04/21/17 1135 100 %     Weight --       Height --      Head Circumference --      Peak Flow --      Pain Score 04/21/17 1138 5     Pain Loc --      Pain Edu? --      Excl. in [place]? --    No data found.  Updated Vital Signs BP 124/76 (BP Location: Left Arm)   Pulse 84   Temp 98.5 F (36.9 C) (Oral)   Resp 18   LMP 06/18/2016 (Exact Date)   SpO2 100%    Physical Exam  Constitutional: She is oriented to person, place, and time. She appears well-developed and well-nourished. No distress.  HENT:  Head: Normocephalic and atraumatic.  Nose: No rhinorrhea. No epistaxis.  Swelling with erythema to the left side of the nose.  Tenderness to palpation around nostril.  Swelling seen at the 12 o'clock region of the left nostril, right next to the opening.  No abscess felt.  Eyes: Conjunctivae are normal. Pupils are equal, round, and reactive to light.  Neurological: She is alert and oriented to person, place, and time.         UC Treatments / Results  Labs (all labs ordered are listed, but only abnormal results are displayed) Labs Reviewed - No data to display  EKG  EKG Interpretation None       Radiology No results found.  Procedures Procedures (including critical care time)  Medications Ordered in UC Medications - No data to display   Initial Impression / Assessment and Plan / UC Course  I have reviewed the triage vital signs and the nursing notes.  Pertinent labs & imaging results that were available during my care of the patient were reviewed by me and considered in my medical decision making (see chart for details).    Will treat for cellulitis with Keflex, Bactroban ointment.  Warm compress.  You can continue Tylenol/Motrin for pain.  Return precautions given.  Patient and spouse expresses understanding and agrees to plan.  Final Clinical Impressions(s) / UC Diagnoses   Final diagnoses:  Cellulitis of face    ED Discharge Orders        Ordered    cephALEXin (KEFLEX) 500 MG capsule  4 times  daily     04/21/17 1220    mupirocin ointment (BACTROBAN) 2 %  2 times daily  04/21/17 1220        Cathlean Sauer V, PA-C 04/21/17 1225

## 2017-04-22 ENCOUNTER — Other Ambulatory Visit: Payer: Self-pay

## 2017-04-22 ENCOUNTER — Other Ambulatory Visit (HOSPITAL_COMMUNITY): Payer: Self-pay

## 2017-04-25 ENCOUNTER — Other Ambulatory Visit: Payer: BLUE CROSS/BLUE SHIELD

## 2017-04-28 ENCOUNTER — Ambulatory Visit: Payer: BLUE CROSS/BLUE SHIELD | Admitting: Obstetrics and Gynecology

## 2017-04-28 ENCOUNTER — Other Ambulatory Visit: Payer: Self-pay

## 2017-04-28 ENCOUNTER — Encounter: Payer: Self-pay | Admitting: Obstetrics and Gynecology

## 2017-04-28 VITALS — BP 118/70 | HR 88 | Resp 16 | Ht 63.5 in | Wt 116.0 lb

## 2017-04-28 DIAGNOSIS — Z0000 Encounter for general adult medical examination without abnormal findings: Secondary | ICD-10-CM

## 2017-04-28 DIAGNOSIS — Z01419 Encounter for gynecological examination (general) (routine) without abnormal findings: Secondary | ICD-10-CM

## 2017-04-28 NOTE — Progress Notes (Signed)
52 y.o. G25P1001 Married [place] female here for annual exam.    Feels poorly when she is in a cold climate.  Feels great when she lives in [place] or [place].  Somewhat "down" here.  Has the name of a psychologist.  Will have CT of abdomen and pelvis 05/09/17 for right sided abdominal pain since hysterectomy surgery. Pain is more prominent when she is not eating well.  Had a nasal cellulitis.  Tx with antibiotics.   PCP:   No PCP  Patient's last menstrual period was 06/18/2016 (exact date).           Sexually active: Yes.    The current method of family planning is status post hysterectomy.    Exercising: No.  The patient does not participate in regular exercise at present. Smoker:  no  Health Maintenance: Pap:  10/2015 normal per patient in [place]. History of abnormal Pap:  no MMG:  08/07/16 BIRADS 1 negative/density c Colonoscopy:  Never.  Has referral [facility].  BMD:   n/a  Result  n/a TDaP:  unsure Gardasil:   n/a HIV: per patient done in [place] prior to moving here Hep C: per patient done in [place] prior to moving here Screening Labs:  Discuss today   reports that  has never smoked. she has never used smokeless tobacco. She reports that she drinks about 4.8 oz of alcohol per week. She reports that she uses drugs. Drug: Marijuana.  Past Medical History:  Diagnosis Date  . Anemia    childhood up until age 6 years old  . Asthma    up until 52 years old  . Dysmenorrhea   . Family history not obtainable due to adoption   . Family history of suicide    patient's son.  . Numbness and tingling of both feet   . Orthodontics    Invisalign, has metal post upper right side of teeth, temporary bridge right side, permanent bridge left side  . Right shoulder pain   . Varicose vein of leg     Past Surgical History:  Procedure Laterality Date  . AUGMENTATION MAMMAPLASTY  76/7341   silicone implants--[place]  . BLADDER SUSPENSION N/A 07/23/2016   Procedure: TRANSVAGINAL TAPE (TVT) PROCEDURE exact midurethral sling;  Surgeon: Nunzio Cobbs, MD;  Location: [facility];  Service: Gynecology;  Laterality: N/A;  . CATARACT EXTRACTION Right 2016   --in [place]  . CESAREAN SECTION    . CYSTOSCOPY N/A 07/23/2016   Procedure: CYSTOSCOPY;  Surgeon: Nunzio Cobbs, MD;  Location: [facility];  Service: Gynecology;  Laterality: N/A;  . TOTAL LAPAROSCOPIC HYSTERECTOMY WITH SALPINGECTOMY Bilateral 07/23/2016   Procedure: HYSTERECTOMY TOTAL LAPAROSCOPIC WITH SALPINGECTOMY collection of pelvic washings;  Surgeon: Nunzio Cobbs, MD;  Location: [facility];  Service: Gynecology;  Laterality: Bilateral;  3 hours OR time  . VARICOSE VEIN SURGERY  2017   in [place]    Current Outpatient Medications  Medication Sig Dispense Refill  . Acetaminophen (TYLENOL PO) Take by mouth as needed.    . Cholecalciferol (VITAMIN D PO) Take by mouth daily.    . hydroxypropyl methylcellulose / hypromellose (ISOPTO TEARS / GONIOVISC) 2.5 % ophthalmic solution Place 2 drops into both eyes as needed for dry eyes.    Javier Docker Oil (OMEGA-3) 500 MG CAPS Take 1,000 mg by mouth daily after breakfast.    . mupirocin ointment (BACTROBAN) 2 % Apply 1 application topically 2 (two) times daily. [place]  g 0  . VITAMIN E PO Take 1 tablet by mouth daily. 300 units     No current facility-administered medications for this visit.     Family History  Adopted: Yes  Problem Relation Age of Onset  . Hypertension Mother   . Hypertension Father   . Hypertension Maternal Grandmother     ROS:  Pertinent items are noted in HPI.  Otherwise, a comprehensive ROS was negative.  Exam:   BP 118/70 (BP Location: Right Arm, Patient Position: Sitting, Cuff Size: Normal)   Pulse 88   Resp 16   Ht 5' 3.5" (1.613 m)   Wt 116 lb (52.6 kg)   LMP 06/18/2016 (Exact Date)   BMI 20.23 kg/m     General appearance: alert, cooperative and appears stated age Head: Normocephalic, without  obvious abnormality, atraumatic Neck: no adenopathy, supple, symmetrical, trachea midline and thyroid normal to inspection and palpation Lungs: clear to auscultation bilaterally Breasts:  Bilateral implants, no masses or tenderness, No nipple retraction or dimpling, No nipple discharge or bleeding, No axillary or supraclavicular adenopathy Heart: regular rate and rhythm Abdomen: soft, non-tender; no masses, no organomegaly Extremities: extremities normal, atraumatic, no cyanosis or edema Skin: Skin color, texture, turgor normal. No rashes or lesions Lymph nodes: Cervical, supraclavicular, and axillary nodes normal. No abnormal inguinal nodes palpated Neurologic: Grossly normal  Pelvic: External genitalia:  no lesions              Urethra:  normal appearing urethra with no masses, tenderness or lesions              Bartholins and Skenes: normal                 Vagina: normal appearing vagina with normal color and discharge, no lesions              Cervix: absent.              Pap taken: No. Bimanual Exam:  Uterus:   Absent.              Adnexa: no mass, fullness, tenderness              Rectal exam: Yes.  .  Confirms.              Anus:  normal sphincter tone, no lesions  Chaperone was present for exam.  Assessment:   Well woman visit with normal exam. Right abdominal pain.  Transition difficulty.   Plan: Mammogram screening discussed. Recommended self breast awareness. Pap and HR HPV as above. Guidelines for Calcium, Vitamin D, regular exercise program including cardiovascular and weight bearing exercise. Proceed with CT scan of abdomen and pelvis.  Colonoscopy referral in place.   Routine screening labs. Contact information for [employer] counselors. Follow up annually and prn.   After visit summary provided.

## 2017-04-28 NOTE — Patient Instructions (Signed)

## 2017-04-29 LAB — COMPREHENSIVE METABOLIC PANEL
ALT: 25 IU/L (ref 0–32)
AST: 24 IU/L (ref 0–40)
Albumin/Globulin Ratio: 1.7 (ref 1.2–2.2)
Albumin: 4.5 g/dL (ref 3.5–5.5)
Alkaline Phosphatase: 53 IU/L (ref 39–117)
BILIRUBIN TOTAL: 0.3 mg/dL (ref 0.0–1.2)
BUN / CREAT RATIO: 13 (ref 9–23)
BUN: 9 mg/dL (ref 6–24)
CHLORIDE: 99 mmol/L (ref 96–106)
CO2: 26 mmol/L (ref 20–29)
Calcium: 9.7 mg/dL (ref 8.7–10.2)
Creatinine, Ser: 0.67 mg/dL (ref 0.57–1.00)
GFR calc Af Amer: 116 mL/min/{1.73_m2} (ref >59)
GFR calc non Af Amer: 101 mL/min/{1.73_m2} (ref >59)
GLUCOSE: 86 mg/dL (ref 65–99)
Globulin, Total: 2.7 g/dL (ref 1.5–4.5)
Potassium: 4.6 mmol/L (ref 3.5–5.2)
Sodium: 140 mmol/L (ref 134–144)
Total Protein: 7.2 g/dL (ref 6.0–8.5)

## 2017-04-29 LAB — LIPID PANEL
CHOL/HDL RATIO: 3.5 ratio (ref 0.0–4.4)
Cholesterol, Total: 225 mg/dL — ABNORMAL HIGH (ref 100–199)
HDL: 65 mg/dL (ref >39)
LDL CALC: 126 mg/dL — AB (ref 0–99)
Triglycerides: 168 mg/dL — ABNORMAL HIGH (ref 0–149)
VLDL CHOLESTEROL CAL: 34 mg/dL (ref 5–40)

## 2017-04-29 LAB — VITAMIN D 25 HYDROXY (VIT D DEFICIENCY, FRACTURES): Vit D, 25-Hydroxy: 34.3 ng/mL (ref 30.0–100.0)

## 2017-04-29 LAB — TSH: TSH: 2.29 u[IU]/mL (ref 0.450–4.500)

## 2017-05-09 ENCOUNTER — Ambulatory Visit
Admission: RE | Admit: 2017-05-09 | Discharge: 2017-05-09 | Disposition: A | Discharge status: Home / Self Care | Payer: BLUE CROSS/BLUE SHIELD | Source: Ambulatory Visit | Attending: Obstetrics and Gynecology | Admitting: Obstetrics and Gynecology

## 2017-05-09 DIAGNOSIS — R109 Unspecified abdominal pain: Secondary | ICD-10-CM

## 2017-05-09 MED ORDER — IOPAMIDOL (ISOVUE-300) INJECTION 61%
100.0000 mL | Freq: Once | INTRAVENOUS | Status: AC | PRN
  Administered 2017-05-09: 100 mL via INTRAVENOUS

## 2017-05-26 ENCOUNTER — Encounter: Payer: Self-pay | Admitting: Obstetrics and Gynecology

## 2017-06-12 ENCOUNTER — Encounter: Payer: BLUE CROSS/BLUE SHIELD | Admitting: Gastroenterology

## 2017-09-23 ENCOUNTER — Telehealth: Payer: Self-pay | Admitting: Obstetrics and Gynecology

## 2017-09-23 NOTE — Telephone Encounter (Signed)
Patient's husband returning [place] call.

## 2017-09-23 NOTE — Telephone Encounter (Signed)
Patient would like to come in and discuss hormone therapy and would like a referral to a dermatologist.

## 2017-09-23 NOTE — Telephone Encounter (Signed)
Left message to call Amey Hossain at [phone].  

## 2017-09-24 NOTE — Telephone Encounter (Signed)
Spoke with patients spouse "[name]", ok per dpr. Requesting OV for HRT consult. Reports insomnia and moodiness. Has been trying OTC supplements, would like to discuss alternatives.   Has appt with [facility] for 09/26/17, referral no longer needed.  OV scheduled with Dr. Quincy Simmonds for 10/03/17 at 10:30am.  Routing to provider for final review. Patient is agreeable to disposition. Will close encounter.

## 2017-10-02 NOTE — Progress Notes (Signed)
GYNECOLOGY  VISIT   HPI: 52 y.o.   Married female   G1P1001 with Patient's last menstrual period was 06/18/2016 (exact date).   here for hormone therapy treatment.    Feels hot at night.  No hot flashes during the day.  She had a lot of emotional sensibility and crying easily.   Saw her gynecologist in [place] and was treated with a soy supplement.  When she took this, she no longer had right sided abdominal pain.   Stress from being far away from [place].  Not liking [place].  Likes [place].   GYNECOLOGIC HISTORY: Patient's last menstrual period was 06/18/2016 (exact date). Contraception:  hysterectomy Menopausal hormone therapy:  physoestrogen daily Last mammogram:  08/07/2016 BIRADS 1: Negative Last pap smear:   10/2015 normal per patient in [place].        OB History    Gravida  1   Para  1   Term  1   Preterm      AB      Living  1     SAB      TAB      Ectopic      Multiple      Live Births                 There are no active problems to display for this patient.   Past Medical History:  Diagnosis Date  . Anemia    childhood up until age 79 years old  . Asthma    up until 52 years old  . Dysmenorrhea   . Family history not obtainable due to adoption   . Family history of suicide    patient's son.  . Numbness and tingling of both feet   . Orthodontics    Invisalign, has metal post upper right side of teeth, temporary bridge right side, permanent bridge left side  . Right shoulder pain   . Varicose vein of leg     Past Surgical History:  Procedure Laterality Date  . AUGMENTATION MAMMAPLASTY  36/6440   silicone implants--[place]  . BLADDER SUSPENSION N/A 07/23/2016   Procedure: TRANSVAGINAL TAPE (TVT) PROCEDURE exact midurethral sling;  Surgeon: Nunzio Cobbs, MD;  Location: [facility];  Service: Gynecology;  Laterality: N/A;  . CATARACT EXTRACTION Right 2016   --in [place]  . CESAREAN SECTION    . CYSTOSCOPY N/A 07/23/2016    Procedure: CYSTOSCOPY;  Surgeon: Nunzio Cobbs, MD;  Location: [facility];  Service: Gynecology;  Laterality: N/A;  . TOTAL LAPAROSCOPIC HYSTERECTOMY WITH SALPINGECTOMY Bilateral 07/23/2016   Procedure: HYSTERECTOMY TOTAL LAPAROSCOPIC WITH SALPINGECTOMY collection of pelvic washings;  Surgeon: Nunzio Cobbs, MD;  Location: [facility];  Service: Gynecology;  Laterality: Bilateral;  3 hours OR time  . VARICOSE VEIN SURGERY  2017   in [place]    Current Outpatient Medications  Medication Sig Dispense Refill  . NON FORMULARY PhysoEstrogen daily    . omega-3 acid ethyl esters (LOVAZA) 1 g capsule Take 2 capsules by mouth daily.    . Cholecalciferol (VITAMIN D PO) Take by mouth daily.    . hydroxypropyl methylcellulose / hypromellose (ISOPTO TEARS / GONIOVISC) 2.5 % ophthalmic solution Place 2 drops into both eyes as needed for dry eyes.    Javier Docker Oil (OMEGA-3) 500 MG CAPS Take 1,000 mg by mouth daily after breakfast.    . VITAMIN E PO Take 1 tablet by mouth daily. 300 units  No current facility-administered medications for this visit.      ALLERGIES: Patient has no known allergies.  Family History  Adopted: Yes  Problem Relation Age of Onset  . Hypertension Mother   . Hypertension Father   . Hypertension Maternal Grandmother     Social History   Socioeconomic History  . Marital status: Married    Spouse name: Not on file  . Number of children: Not on file  . Years of education: Not on file  . Highest education level: Not on file  Occupational History  . Not on file  Social Needs  . Financial resource strain: Not on file  . Food insecurity:    Worry: Not on file    Inability: Not on file  . Transportation needs:    Medical: Not on file    Non-medical: Not on file  Tobacco Use  . Smoking status: Never Smoker  . Smokeless tobacco: Never Used  Substance and Sexual Activity  . Alcohol use: Yes    Alcohol/week: 8.0 standard drinks    Types: 8 Glasses of  wine per week    Comment: wine , beer  . Drug use: Yes    Types: Marijuana  . Sexual activity: Yes    Partners: Male    Birth control/protection: None, Surgical    Comment: Hyst  Lifestyle  . Physical activity:    Days per week: Not on file    Minutes per session: Not on file  . Stress: Not on file  Relationships  . Social connections:    Talks on phone: Not on file    Gets together: Not on file    Attends religious service: Not on file    Active member of club or organization: Not on file    Attends meetings of clubs or organizations: Not on file    Relationship status: Not on file  . Intimate partner violence:    Fear of current or ex partner: Not on file    Emotionally abused: Not on file    Physically abused: Not on file    Forced sexual activity: Not on file  Other Topics Concern  . Not on file  Social History Narrative  . Not on file    Review of Systems  Constitutional: Positive for fever.  HENT: Negative.   Eyes: Negative.   Respiratory: Negative.   Cardiovascular: Negative.   Gastrointestinal: Negative.   Endocrine: Negative.   Genitourinary: Negative.        Loss of sexual interest Pain or bleeding with intercourse  Musculoskeletal: Negative.   Skin: Negative.   Allergic/Immunologic: Negative.   Neurological: Negative.   Hematological: Negative.   Psychiatric/Behavioral: Negative.   All other systems reviewed and are negative.   PHYSICAL EXAMINATION:    BP 124/78   Pulse 75   Resp 16   Ht 5' 3.5" (1.613 m)   Wt 118 lb (53.5 kg)   LMP 06/18/2016 (Exact Date)   BMI 20.57 kg/m     General appearance: alert, cooperative and appears stated age  ASSESSMENT  Menopausal symptoms.  Situational stress.   PLAN  We talked about menopause and the physical and emotional changes that occur during this time.  Brochure on menopause.  Will check FSH and estradiol. I recommended Estroven.  I also discussed ERT and SSRIs as options as well.  FU prn.     An After Visit Summary was printed and given to the patient.  __25___ minutes face to face time of  which over 50% was spent in counseling.

## 2017-10-03 ENCOUNTER — Other Ambulatory Visit: Payer: Self-pay

## 2017-10-03 ENCOUNTER — Ambulatory Visit: Payer: BLUE CROSS/BLUE SHIELD | Admitting: Obstetrics and Gynecology

## 2017-10-03 ENCOUNTER — Encounter: Payer: Self-pay | Admitting: Obstetrics and Gynecology

## 2017-10-03 VITALS — BP 124/78 | HR 75 | Resp 16 | Ht 63.5 in | Wt 118.0 lb

## 2017-10-03 DIAGNOSIS — N951 Menopausal and female climacteric states: Secondary | ICD-10-CM | POA: Diagnosis not present

## 2017-10-03 DIAGNOSIS — F439 Reaction to severe stress, unspecified: Secondary | ICD-10-CM | POA: Diagnosis not present

## 2017-10-03 NOTE — Patient Instructions (Signed)
Try Hoy Register for menopausal symptoms. This contains soy and black cohosh.  Menopause and Herbal Products What is menopause? Menopause is the normal time of life when menstrual periods decrease in frequency and eventually stop completely. This process can take several years for some women. Menopause is complete when you have had an absence of menstruation for a full year since your last menstrual period. It usually occurs between the ages of 78 and 84. It is not common for menopause to begin before the age of 32. During menopause, your body stops producing the female hormones estrogen and progesterone. Common symptoms associated with this loss of hormones (vasomotor symptoms) are:  Hot flashes.  Hot flushes.  Night sweats.  Other common symptoms and complications of menopause include:  Decrease in sex drive.  Vaginal dryness and thinning of the walls of the vagina. This can make sex painful.  Dryness of the skin and development of wrinkles.  Headaches.  Tiredness.  Irritability.  Memory problems.  Weight gain.  Bladder infections.  Hair growth on the face and chest.  Inability to reproduce offspring (infertility).  Loss of density in the bones (osteoporosis) increasing your risk for breaks (fractures).  Depression.  Hardening and narrowing of the arteries (atherosclerosis). This increases your risk of heart attack and stroke.  What treatment options are available? There are many treatment choices for menopause symptoms. The most common treatment is hormone replacement therapy. Many alternative therapies for menopause are emerging, including the use of herbal products. These supplements can be found in the form of herbs, teas, oils, tinctures, and pills. Common herbal supplements for menopause are made from plants that contain phytoestrogens. Phytoestrogens are compounds that occur naturally in plants and plant products. They act like estrogen in the body. Foods and herbs  that contain phytoestrogens include:  Soy.  Flax seeds.  Red clover.  Ginseng.  What menopause symptoms may be helped if I use herbal products?  Vasomotor symptoms. These may be helped by: ? Soy. Some studies show that soy may have a moderate benefit for hot flashes. ? Black cohosh. There is limited evidence indicating this may be beneficial for hot flashes.  Symptoms that are related to heart and blood vessel disease. These may be helped by soy. Studies have shown that soy can help to lower cholesterol.  Depression. This may be helped by: ? St. [name]'s wort. There is limited evidence that shows this may help mild to moderate depression. ? Black cohosh. There is evidence that this may help depression and mood swings.  Osteoporosis. Soy may help to decrease bone loss that is associated with menopause and may prevent osteoporosis. Limited evidence indicates that red clover may offer some bone loss protection as well. Other herbal products that are commonly used during menopause lack enough evidence to support their use as a replacement for conventional menopause therapies. These products include evening primrose, ginseng, and red clover. What are the cases when herbal products should not be used during menopause? Do not use herbal products during menopause without your health care provider's approval if:  You are taking medicine.  You have a preexisting liver condition.  Are there any risks in my taking herbal products during menopause? If you choose to use herbal products to help with symptoms of menopause, keep in mind that:  Different supplements have different and unmeasured amounts of herbal ingredients.  Herbal products are not regulated the same way that medicines are.  Concentrations of herbs may vary depending on the way they  are prepared. For example, the concentration may be different in a pill, tea, oil, and tincture.  Little is known about the risks of using herbal  products, particularly the risks of long-term use.  Some herbal supplements can be harmful when combined with certain medicines.  Most commonly reported side effects of herbal products are mild. However, if used improperly, many herbal supplements can cause serious problems. Talk to your health care provider before starting any herbal product. If problems develop, stop taking the supplement and let your health care provider know. This information is not intended to replace advice given to you by your health care provider. Make sure you discuss any questions you have with your health care provider. Document Released: 07/31/2007 Document Revised: 01/09/2016 Document Reviewed: 07/27/2013 Elsevier Interactive Patient Education  2017 [employer].

## 2017-10-04 LAB — ESTRADIOL: Estradiol: <5 pg/mL

## 2017-10-04 LAB — FOLLICLE STIMULATING HORMONE: FSH: 83.8 m[IU]/mL

## 2017-10-06 ENCOUNTER — Telehealth: Payer: Self-pay | Admitting: *Deleted

## 2017-10-06 NOTE — Telephone Encounter (Signed)
Spoke with patients spouse, [name], ok per dpr. Advised as seen below per Dr. Quincy Simmonds. Spouse verbalizes understanding and is agreeable. Encounter closed.

## 2017-10-06 NOTE — Telephone Encounter (Signed)
-----   Message from Nunzio Cobbs, MD sent at 10/05/2017 11:59 AM EDT ----- Please report results to patient.  Her labs are consistent with menopause.  She has had a hysterectomy, so we were not able to follow her menstruation as a clue to her status.  She has chosen to treat with herbal remedies so far.

## 2017-10-06 NOTE — Telephone Encounter (Signed)
Notes recorded by Burnice Logan, RN on 10/06/2017 at 2:39 PM EDT Left message to call Sharee Pimple at [phone].

## 2017-10-13 ENCOUNTER — Telehealth: Payer: Self-pay | Admitting: Obstetrics and Gynecology

## 2017-10-13 NOTE — Telephone Encounter (Signed)
Routing to Dr. Silva to review and advise.  

## 2017-10-13 NOTE — Telephone Encounter (Signed)
Patient's spouse Crystal Gardner asked for a letter from [place] stating that he is involved in her care and treatments. Crystal Gardner asked the letter state that he brings [name] to her appointments, makes her appointments, receives her results and talks with Dr.Silva's and her nurses. Crystal Gardner asked that the letter include that they have a happy loving marriage and do live together and is listed on her DPR and is her emergency contact. Crystal Gardner asked for this letter due to [facility] applying for a 10 year green card. I did mail the 2018 and 2019 Designated party release forms on file to the address on file.

## 2017-10-16 NOTE — Telephone Encounter (Signed)
I am happy to assist the patient.  Maybe she should come in to talk about the letter with me.

## 2017-10-17 NOTE — Telephone Encounter (Signed)
Spoke with patient's husband Crystal Gardner who answered the phone and is on her designated party release form.  Office visit with Dr. Quincy Simmonds scheduled for 10/22/2017 to discuss request.  Encounter closed.

## 2017-10-22 ENCOUNTER — Encounter: Payer: Self-pay | Admitting: Obstetrics and Gynecology

## 2017-10-22 ENCOUNTER — Ambulatory Visit: Payer: BLUE CROSS/BLUE SHIELD | Admitting: Obstetrics and Gynecology

## 2017-10-22 VITALS — BP 100/56 | HR 72 | Resp 14 | Ht 63.5 in | Wt 118.0 lb

## 2017-10-22 DIAGNOSIS — Z789 Other specified health status: Secondary | ICD-10-CM

## 2017-10-22 NOTE — Progress Notes (Signed)
GYNECOLOGY  VISIT   HPI: 52 y.o.   Married  [place]  female   [phone] with Patient's last menstrual period was 06/18/2016 (exact date).   here for discuss letter to the [place] and Immigration Services regarding I-751, petition to remove conditions on residency for [employer].   Husband present for the visit today.  Applying for her permanent visa for the [place] and remove her conditional visa.   Need to document that they are still married and that they combine their resources and use each other for emergency contacts.  [name], her husband, has been involved in her care since she became my patient on 04/26/16. They are committed to each other, and he is supportive of her care.  He assists with communication for his wife and does speak and understand [place].  Husband is called for scheduling appointments and receiving test results.  He has supported her and cared for her through her GYN surgical care.  He stayed with her in the hospital. He is an integral part of her health care team.   Our life is here, she says.   GYNECOLOGIC HISTORY: Patient's last menstrual period was 06/18/2016 (exact date). Contraception:  Hysterectomy Menopausal hormone therapy:  Isoflavone Last mammogram: 08/07/16 BIRADS 1 negative Last pap smear:   10/2015 normal per patient in [place]        OB History    Gravida  1   Para  1   Term  1   Preterm      AB      Living  1     SAB      TAB      Ectopic      Multiple      Live Births                 There are no active problems to display for this patient.   Past Medical History:  Diagnosis Date  . Anemia    childhood up until age 81 years old  . Asthma    up until 52 years old  . Dysmenorrhea   . Family history not obtainable due to adoption   . Family history of suicide    patient's son.  . Numbness and tingling of both feet   . Orthodontics    Invisalign, has metal post upper right side of  teeth, temporary bridge right side, permanent bridge left side  . Right shoulder pain   . Varicose vein of leg     Past Surgical History:  Procedure Laterality Date  . AUGMENTATION MAMMAPLASTY  11/1749   silicone implants--[place]  . BLADDER SUSPENSION N/A 07/23/2016   Procedure: TRANSVAGINAL TAPE (TVT) PROCEDURE exact midurethral sling;  Surgeon: Nunzio Cobbs, MD;  Location: [facility];  Service: Gynecology;  Laterality: N/A;  . CATARACT EXTRACTION Right 2016   --in [place]  . CESAREAN SECTION    . CYSTOSCOPY N/A 07/23/2016   Procedure: CYSTOSCOPY;  Surgeon: Nunzio Cobbs, MD;  Location: [facility];  Service: Gynecology;  Laterality: N/A;  . TOTAL LAPAROSCOPIC HYSTERECTOMY WITH SALPINGECTOMY Bilateral 07/23/2016   Procedure: HYSTERECTOMY TOTAL LAPAROSCOPIC WITH SALPINGECTOMY collection of pelvic washings;  Surgeon: Nunzio Cobbs, MD;  Location: [facility];  Service: Gynecology;  Laterality: Bilateral;  3 hours OR time  . VARICOSE VEIN SURGERY  2017   in [place]    Current Outpatient Medications  Medication Sig Dispense Refill  . Cholecalciferol (VITAMIN D  PO) Take by mouth daily.    . hydroxypropyl methylcellulose / hypromellose (ISOPTO TEARS / GONIOVISC) 2.5 % ophthalmic solution Place 2 drops into both eyes as needed for dry eyes.    Javier Docker Oil (OMEGA-3) 500 MG CAPS Take 1,000 mg by mouth daily after breakfast.    . omega-3 acid ethyl esters (LOVAZA) 1 g capsule Take 2 capsules by mouth daily.    . SOY ISOFLAVONE PO Take by mouth daily.    [name] Kitchen VITAMIN E PO Take 1 tablet by mouth daily. 300 units     No current facility-administered medications for this visit.      ALLERGIES: Patient has no known allergies.  Family History  Adopted: Yes  Problem Relation Age of Onset  . Hypertension Mother   . Hypertension Father   . Hypertension Maternal Grandmother     Social History   Socioeconomic History  . Marital status: Married    Spouse name: Not on file   . Number of children: Not on file  . Years of education: Not on file  . Highest education level: Not on file  Occupational History  . Not on file  Social Needs  . Financial resource strain: Not on file  . Food insecurity:    Worry: Not on file    Inability: Not on file  . Transportation needs:    Medical: Not on file    Non-medical: Not on file  Tobacco Use  . Smoking status: Never Smoker  . Smokeless tobacco: Never Used  Substance and Sexual Activity  . Alcohol use: Yes    Alcohol/week: 8.0 standard drinks    Types: 8 Glasses of wine per week    Comment: wine , beer  . Drug use: Yes    Types: Marijuana  . Sexual activity: Yes    Partners: Male    Birth control/protection: None, Surgical    Comment: Hyst  Lifestyle  . Physical activity:    Days per week: Not on file    Minutes per session: Not on file  . Stress: Not on file  Relationships  . Social connections:    Talks on phone: Not on file    Gets together: Not on file    Attends religious service: Not on file    Active member of club or organization: Not on file    Attends meetings of clubs or organizations: Not on file    Relationship status: Not on file  . Intimate partner violence:    Fear of current or ex partner: Not on file    Emotionally abused: Not on file    Physically abused: Not on file    Forced sexual activity: Not on file  Other Topics Concern  . Not on file  Social History Narrative  . Not on file    Review of Systems  All other systems reviewed and are negative.   PHYSICAL EXAMINATION:    BP (!) 100/56 (BP Location: Right Arm, Patient Position: Sitting, Cuff Size: Normal)   Pulse 72   Resp 14   Ht 5' 3.5" (1.613 m)   Wt 118 lb (53.5 kg)   LMP 06/18/2016 (Exact Date)   BMI 20.57 kg/m     General appearance: alert, cooperative and appears stated age   [facility] care team letter. Permanent [place] Visa petition to remove conditional status.  PLAN  Letter of support  written.    An After Visit Summary was printed and given to the patient.

## 2017-10-24 ENCOUNTER — Encounter: Payer: Self-pay | Admitting: Obstetrics and Gynecology

## 2018-01-27 ENCOUNTER — Telehealth: Payer: Self-pay | Admitting: Obstetrics and Gynecology

## 2018-01-27 NOTE — Telephone Encounter (Signed)
Spoke with patients spouse, [name], ok per dpr. [name] reports patient is in pain 95% of the time since her hysterectomy. Does not want to get out of bed. Reports pain "at location of hysterectomy and on right side". Worse on cold days. Reports nausea and "bad mood". No new symptoms since seeing Dr. Quincy Simmonds last. Denies fever/chills, urinary symptoms, vomiting or vaginal bleeding. Regular BMs. Patient never tried estroven, requesting to start estrogen pill.   Recommended OV for further discussion with Dr. Quincy Simmonds, OV scheduled for 12/4 at 9am with Dr. Quincy Simmonds.   Routing to provider for final review. Patient is agreeable to disposition. Will close encounter.

## 2018-01-27 NOTE — Telephone Encounter (Signed)
Patient's husband called on behalf of his wife. Patient is experiencing pain on the right side and has been experiencing "a deep pain" at the location of her hysterectomy. Patient had a hysterectomy done a year and a half ago. Patient's husband stated that the pain "debilitates" her "most days." Patient's husband stated that she had discussed an "estrogen pill" at her last visit with Dr. Quincy Simmonds.

## 2018-01-27 NOTE — Progress Notes (Signed)
GYNECOLOGY  VISIT   HPI: 52 y.o.   Married  [place]  female   [phone] with Patient's last menstrual period was 06/18/2016 (exact date).   here for right sided mid abdominal pain.  This is worse when she is cold.  This is a discomfort. Bothersome every day.  Pressure on the area makes the pain worse - wearing jeans, vaccuuming.  Cold makes the pain worse. Humidity of [place] makes the pain worse. Not enjoying living in [place]. Going to [place] in March.  Advil improves the pain.  BM improves the pain.   States she does feel depressed and isolated here.  Her husband knows this and is a Engineer, water.  CT scan 05/09/17 for RLQ pain.  Result normal.  Had a total laparoscopic hysterectomy with bilateral salpingectomy, left oophorectomy, collection of pelvic washings, TVT Exact midurethral sling, and cystoscopy on 07/23/17.  Findings at surgery were:   Laparoscopy revealed a normal uterus, bilateral tubes and right ovary.  The left adnexa contained a  4.5 cm paraovarian or left paratubal cyst.  The upper abdomen was normal and demonstrated a normal liver.  There was a minimal adhesion along the right abdominal sidewall near the cecum.  This was not removed. There was no evidence of endometriosis in the abdomen or pelvis.  Cystoscopy at the termination of the procedure showed the bladder to be normal throughout 360 degrees including the bladder dome and trigone. There was no evidence of any foreign body in the bladder or the urethra. There was no evidence of any lesions of the bladder or the urethra. Both of the ureters were noted to be patent bilaterally prior to and with placement of the midurethral sling.   Has not completed colonoscopy.   GYNECOLOGIC HISTORY: Patient's last menstrual period was 06/18/2016 (exact date). Contraception: Hysterectomy Menopausal hormone therapy:  none Last mammogram: 08-07-16 Implants/Neg/density C/BiRads1 Last pap smear: 10/2015 normal per  patient in [place]        OB History    Gravida  1   Para  1   Term  1   Preterm      AB      Living  1     SAB      TAB      Ectopic      Multiple      Live Births                 There are no active problems to display for this patient.   Past Medical History:  Diagnosis Date  . Anemia    childhood up until age 66 years old  . Asthma    up until 52 years old  . Dysmenorrhea   . Family history not obtainable due to adoption   . Family history of suicide    patient's son.  . Numbness and tingling of both feet   . Orthodontics    Invisalign, has metal post upper right side of teeth, temporary bridge right side, permanent bridge left side  . Right shoulder pain   . Varicose vein of leg     Past Surgical History:  Procedure Laterality Date  . AUGMENTATION MAMMAPLASTY  40/0867   silicone implants--[place]  . BLADDER SUSPENSION N/A 07/23/2016   Procedure: TRANSVAGINAL TAPE (TVT) PROCEDURE exact midurethral sling;  Surgeon: Nunzio Cobbs, MD;  Location: [facility];  Service: Gynecology;  Laterality: N/A;  . CATARACT EXTRACTION Right 2016   --in [place]  . CESAREAN SECTION    .  CYSTOSCOPY N/A 07/23/2016   Procedure: CYSTOSCOPY;  Surgeon: Nunzio Cobbs, MD;  Location: [facility];  Service: Gynecology;  Laterality: N/A;  . TOTAL LAPAROSCOPIC HYSTERECTOMY WITH SALPINGECTOMY Bilateral 07/23/2016   Procedure: HYSTERECTOMY TOTAL LAPAROSCOPIC WITH SALPINGECTOMY collection of pelvic washings;  Surgeon: Nunzio Cobbs, MD;  Location: [facility];  Service: Gynecology;  Laterality: Bilateral;  3 hours OR time  . VARICOSE VEIN SURGERY  2017   in [place]    Current Outpatient Medications  Medication Sig Dispense Refill  . Cholecalciferol (VITAMIN D PO) Take by mouth daily.    . hydroxypropyl methylcellulose / hypromellose (ISOPTO TEARS / GONIOVISC) 2.5 % ophthalmic solution Place 2 drops into both eyes as needed for dry eyes.    Javier Docker Oil (OMEGA-3)  500 MG CAPS Take 1,000 mg by mouth daily after breakfast.    . omega-3 acid ethyl esters (LOVAZA) 1 g capsule Take 2 capsules by mouth daily.     No current facility-administered medications for this visit.      ALLERGIES: Patient has no known allergies.  Family History  Adopted: Yes  Problem Relation Age of Onset  . Hypertension Mother   . Hypertension Father   . Hypertension Maternal Grandmother     Social History   Socioeconomic History  . Marital status: Married    Spouse name: Not on file  . Number of children: Not on file  . Years of education: Not on file  . Highest education level: Not on file  Occupational History  . Not on file  Social Needs  . Financial resource strain: Not on file  . Food insecurity:    Worry: Not on file    Inability: Not on file  . Transportation needs:    Medical: Not on file    Non-medical: Not on file  Tobacco Use  . Smoking status: Never Smoker  . Smokeless tobacco: Never Used  Substance and Sexual Activity  . Alcohol use: Yes    Alcohol/week: 8.0 standard drinks    Types: 8 Glasses of wine per week    Comment: wine , beer  . Drug use: Yes    Types: Marijuana  . Sexual activity: Yes    Partners: Male    Birth control/protection: None, Surgical    Comment: Hyst  Lifestyle  . Physical activity:    Days per week: Not on file    Minutes per session: Not on file  . Stress: Not on file  Relationships  . Social connections:    Talks on phone: Not on file    Gets together: Not on file    Attends religious service: Not on file    Active member of club or organization: Not on file    Attends meetings of clubs or organizations: Not on file    Relationship status: Not on file  . Intimate partner violence:    Fear of current or ex partner: Not on file    Emotionally abused: Not on file    Physically abused: Not on file    Forced sexual activity: Not on file  Other Topics Concern  . Not on file  Social History Narrative  . Not  on file    Review of Systems  HENT: Positive for ear pain.   Endocrine: Positive for cold intolerance and heat intolerance.  Musculoskeletal:       Muscle and joint aches  Skin: Positive for rash.       Rash on arms/elbow--itches  Psychiatric/Behavioral:       Depression and excessive crying  All other systems reviewed and are negative.   PHYSICAL EXAMINATION:    BP 122/78 (BP Location: Right Arm, Patient Position: Sitting, Cuff Size: Normal)   Ht 5' 3.5" (1.613 m)   Wt 118 lb 12.8 oz (53.9 kg)   LMP 06/18/2016 (Exact Date)   BMI 20.71 kg/m     General appearance: alert, cooperative and appears stated age Head: Normocephalic, without obvious abnormality, atraumatic Neck: no adenopathy, supple, symmetrical, trachea midline and thyroid normal to inspection and palpation Lungs: clear to auscultation bilaterally Breasts: normal appearance, no masses or tenderness, No nipple retraction or dimpling, No nipple discharge or bleeding, No axillary or supraclavicular adenopathy Heart: regular rate and rhythm Abdomen: soft, non-tender, no masses,  no organomegaly Extremities: extremities normal, atraumatic, no cyanosis or edema Skin: Skin color, texture, turgor normal. No rashes or lesions Lymph nodes: Cervical, supraclavicular, and axillary nodes normal. No abnormal inguinal nodes palpated Neurologic: Grossly normal  Pelvic: External genitalia:  no lesions              Urethra:  normal appearing urethra with no masses, tenderness or lesions              Bartholins and Skenes: normal                 Vagina: normal appearing vagina with normal color and discharge, no lesions              Cervix: absent                Bimanual Exam:  Uterus:   absent              Adnexa: no mass, fullness, tenderness              Rectal exam: Yes.  .  Confirms.              Anus:  normal sphincter tone, no lesions  Chaperone was present for exam.  ASSESSMENT  Right lateral abdominal pain.  I  discussed adhesions noted at the time of her laparoscopy.  I do not believe this is the cause of her pain.  Situational stress.    PLAN  Declines colonoscopy and probiotics.  I recommended mammogram. I do not think that ERT will make her abdominal pain resolve.   I recommended she make plans for trips to leave [place] periodically.  FU prn.    An After Visit Summary was printed and given to the patient.  __25____ minutes face to face time of which over 50% was spent in counseling.

## 2018-01-28 ENCOUNTER — Encounter: Payer: Self-pay | Admitting: Obstetrics and Gynecology

## 2018-01-28 ENCOUNTER — Ambulatory Visit (INDEPENDENT_AMBULATORY_CARE_PROVIDER_SITE_OTHER): Payer: BLUE CROSS/BLUE SHIELD | Admitting: Obstetrics and Gynecology

## 2018-01-28 ENCOUNTER — Other Ambulatory Visit: Payer: Self-pay

## 2018-01-28 VITALS — BP 122/78 | Ht 63.5 in | Wt 118.8 lb

## 2018-01-28 DIAGNOSIS — R109 Unspecified abdominal pain: Secondary | ICD-10-CM

## 2018-01-28 DIAGNOSIS — F439 Reaction to severe stress, unspecified: Secondary | ICD-10-CM | POA: Insufficient documentation

## 2018-02-07 ENCOUNTER — Encounter (HOSPITAL_COMMUNITY): Payer: Self-pay | Admitting: Emergency Medicine

## 2018-02-07 ENCOUNTER — Ambulatory Visit (INDEPENDENT_AMBULATORY_CARE_PROVIDER_SITE_OTHER): Payer: BLUE CROSS/BLUE SHIELD

## 2018-02-07 ENCOUNTER — Ambulatory Visit (HOSPITAL_COMMUNITY)
Admission: EM | Admit: 2018-02-07 | Discharge: 2018-02-07 | Disposition: A | Discharge status: Home / Self Care | Payer: BLUE CROSS/BLUE SHIELD | Attending: Emergency Medicine | Admitting: Emergency Medicine

## 2018-02-07 DIAGNOSIS — J029 Acute pharyngitis, unspecified: Secondary | ICD-10-CM | POA: Diagnosis not present

## 2018-02-07 DIAGNOSIS — B9789 Other viral agents as the cause of diseases classified elsewhere: Secondary | ICD-10-CM | POA: Diagnosis not present

## 2018-02-07 DIAGNOSIS — J069 Acute upper respiratory infection, unspecified: Secondary | ICD-10-CM

## 2018-02-07 DIAGNOSIS — M25511 Pain in right shoulder: Secondary | ICD-10-CM | POA: Diagnosis not present

## 2018-02-07 DIAGNOSIS — R05 Cough: Secondary | ICD-10-CM | POA: Insufficient documentation

## 2018-02-07 LAB — POCT RAPID STREP A: Streptococcus, Group A Screen (Direct): NEGATIVE

## 2018-02-07 MED ORDER — PSEUDOEPH-BROMPHEN-DM 30-2-10 MG/5ML PO SYRP: 5.0000 mL | ORAL_SOLUTION | Freq: Four times a day (QID) | ORAL | 0 refills | Status: DC | PRN

## 2018-02-07 MED ORDER — CETIRIZINE HCL 10 MG PO CAPS: 10.0000 mg | ORAL_CAPSULE | Freq: Every day | ORAL | 0 refills | Status: DC

## 2018-02-07 NOTE — Discharge Instructions (Signed)
NO pneumonia You likely having a viral upper respiratory infection. We recommended symptom control. I expect your symptoms to start improving in the next 1-2 weeks.   1. Take a daily allergy pill/anti-histamine like Zyrtec, Claritin, or Store brand consistently for 2 weeks  2. For congestion you may try an oral decongestant like Mucinex or sudafed. You may also try intranasal flonase nasal spray or saline irrigations (neti pot, sinus cleanse)  3. For your sore throat you may try cepacol lozenges, salt water gargles, throat spray. Treatment of congestion may also help your sore throat.  4. For cough you may try Robitussen, Mucinex DM  5. Take Tylenol or Ibuprofen to help with pain/inflammation  6. Stay hydrated, drink plenty of fluids to keep throat coated and less irritated  Honey Tea For cough/sore throat try using a honey-based tea. Use 3 teaspoons of honey with juice squeezed from half lemon. Place shaved pieces of ginger into 1/2-1 cup of water and warm over stove top. Then mix the ingredients and repeat every 4 hours as needed.

## 2018-02-07 NOTE — ED Triage Notes (Signed)
Pt c/o cough, uri, headache, states shes been blowing her nose a lot, chest pain with cough since Wednesday.

## 2018-02-08 NOTE — ED Provider Notes (Signed)
[facility]    CSN: [id number] Arrival date & time: 02/07/18  1607     History   Chief Complaint Chief Complaint  Patient presents with  . Cough  . URI    HPI Crystal Gardner is a 52 y.o. female no contributing past medical history; Patient is presenting with URI symptoms- congestion, cough, sore throat.  She has also had ear discomfort, headaches.  She is developed chest discomfort associated with coughing.  Patient's main complaints are overall feeling poor, concern for pneumonia. Symptoms have been going on for 3 to 4 days, noted symptoms worsening after the rain. Patient has tried Mucinex, Tylenol and Advil, with minimal relief. Denies fever, nausea, vomiting, diarrhea. Denies shortness of breath and chest pain.  Does note recently had cataract surgery 3 days ago.  She is unsure if she was intubated.   HPI  Past Medical History:  Diagnosis Date  . Anemia    childhood up until age 31 years old  . Asthma    up until 52 years old  . Dysmenorrhea   . Family history not obtainable due to adoption   . Family history of suicide    patient's son.  . Numbness and tingling of both feet   . Orthodontics    Invisalign, has metal post upper right side of teeth, temporary bridge right side, permanent bridge left side  . Right shoulder pain   . Varicose vein of leg     Patient Active Problem List   Diagnosis Date Noted  . Situational stress 01/28/2018    Past Surgical History:  Procedure Laterality Date  . AUGMENTATION MAMMAPLASTY  39/0300   silicone implants--[place]  . BLADDER SUSPENSION N/A 07/23/2016   Procedure: TRANSVAGINAL TAPE (TVT) PROCEDURE exact midurethral sling;  Surgeon: Nunzio Cobbs, MD;  Location: [facility];  Service: Gynecology;  Laterality: N/A;  . CATARACT EXTRACTION Right 2016   --in [place]  . CESAREAN SECTION    . CYSTOSCOPY N/A 07/23/2016   Procedure: CYSTOSCOPY;  Surgeon: Nunzio Cobbs, MD;  Location: [facility];   Service: Gynecology;  Laterality: N/A;  . TOTAL LAPAROSCOPIC HYSTERECTOMY WITH SALPINGECTOMY Bilateral 07/23/2016   Procedure: HYSTERECTOMY TOTAL LAPAROSCOPIC WITH SALPINGECTOMY collection of pelvic washings;  Surgeon: Nunzio Cobbs, MD;  Location: [facility];  Service: Gynecology;  Laterality: Bilateral;  3 hours OR time  . VARICOSE VEIN SURGERY  2017   in [place]    OB History    Gravida  1   Para  1   Term  1   Preterm      AB      Living  1     SAB      TAB      Ectopic      Multiple      Live Births               Home Medications    Prior to Admission medications   Medication Sig Start Date End Date Taking? Authorizing Provider  brompheniramine-pseudoephedrine-DM 30-2-10 MG/5ML syrup Take 5 mLs by mouth 4 (four) times daily as needed. 02/07/18   Corbet Hanley C, PA-C  Cetirizine HCl 10 MG CAPS Take 1 capsule (10 mg total) by mouth daily for 10 days. 02/07/18 02/17/18  Colleen Donahoe C, PA-C  Cholecalciferol (VITAMIN D PO) Take by mouth daily.    [provider]  hydroxypropyl methylcellulose / hypromellose (ISOPTO TEARS / GONIOVISC) 2.5 % ophthalmic solution Place  2 drops into both eyes as needed for dry eyes.    [provider]  Astrid Drafts (OMEGA-3) 500 MG CAPS Take 1,000 mg by mouth daily after breakfast.    [provider]  omega-3 acid ethyl esters (LOVAZA) 1 g capsule Take 2 capsules by mouth daily.    [provider]    Family History Family History  Adopted: Yes  Problem Relation Age of Onset  . Hypertension Mother   . Hypertension Father   . Hypertension Maternal Grandmother     Social History Social History   Tobacco Use  . Smoking status: Never Smoker  . Smokeless tobacco: Never Used  Substance Use Topics  . Alcohol use: Yes    Alcohol/week: 8.0 standard drinks    Types: 8 Glasses of wine per week    Comment: wine , beer  . Drug use: Yes    Types: Marijuana     Allergies   Patient has  no known allergies.   Review of Systems Review of Systems  Constitutional: Negative for activity change, appetite change, chills, fatigue and fever.  HENT: Positive for congestion, ear pain, rhinorrhea and sore throat. Negative for sinus pressure and trouble swallowing.   Eyes: Negative for discharge and redness.  Respiratory: Positive for cough and chest tightness. Negative for shortness of breath.   Cardiovascular: Negative for chest pain.  Gastrointestinal: Negative for abdominal pain, diarrhea, nausea and vomiting.  Musculoskeletal: Negative for myalgias.  Skin: Negative for rash.  Neurological: Positive for headaches. Negative for dizziness and light-headedness.     Physical Exam Triage Vital Signs ED Triage Vitals  Enc Vitals Group     BP 02/07/18 1715 132/80     Pulse Rate 02/07/18 1715 90     Resp 02/07/18 1715 20     Temp 02/07/18 1715 98.3 F (36.8 C)     Temp src --      SpO2 02/07/18 1715 100 %     Weight --      Height --      Head Circumference --      Peak Flow --      Pain Score 02/07/18 1717 6     Pain Loc --      Pain Edu? --      Excl. in [place]? --    No data found.  Updated Vital Signs BP 132/80   Pulse 90   Temp 98.3 F (36.8 C)   Resp 20   LMP 06/18/2016 (Exact Date)   SpO2 100%   Visual Acuity Right Eye Distance:   Left Eye Distance:   Bilateral Distance:    Right Eye Near:   Left Eye Near:    Bilateral Near:     Physical Exam Vitals signs and nursing note reviewed.  Constitutional:      General: She is not in acute distress.    Appearance: She is well-developed.  HENT:     Head: Normocephalic and atraumatic.     Ears:     Comments: Bilateral ears without tenderness to palpation of external auricle, tragus and mastoid, EAC's without erythema or swelling, TM's with good bony landmarks and cone of light. Non erythematous.    Nose:     Comments: Nasal mucosa erythematous, swollen turbinates, rhinorrhea present bilaterally, areas of  irritation with small amount of blood exposed, no active bleeding    Mouth/Throat:     Comments: Oral mucosa pink and moist, no tonsillar enlargement or exudate. Posterior pharynx patent and  nonerythematous, no uvula deviation or swelling. Normal phonation. Eyes:     Conjunctiva/sclera: Conjunctivae normal.  Neck:     Musculoskeletal: Neck supple.  Cardiovascular:     Rate and Rhythm: Normal rate and regular rhythm.     Heart sounds: No murmur.  Pulmonary:     Effort: Pulmonary effort is normal. No respiratory distress.     Breath sounds: Normal breath sounds.     Comments: Breathing comfortably at rest, CTABL, no wheezing, rales or other adventitious sounds auscultated Abdominal:     Palpations: Abdomen is soft.     Tenderness: There is no abdominal tenderness.  Skin:    General: Skin is warm and dry.  Neurological:     Mental Status: She is alert.      UC Treatments / Results  Labs (all labs ordered are listed, but only abnormal results are displayed) Labs Reviewed  CULTURE, GROUP A STREP [facility])  POCT RAPID STREP A    EKG None  Radiology Dg Chest 2 View  Result Date: 02/07/2018 CLINICAL DATA:  Cough and congestion EXAM: CHEST - 2 VIEW COMPARISON:  None. FINDINGS: The heart size and mediastinal contours are within normal limits. Both lungs are clear. The visualized skeletal structures are unremarkable. IMPRESSION: No active cardiopulmonary disease. Electronically Signed   By: Ulyses Jarred M.D.   On: 02/07/2018 18:17    Procedures Procedures (including critical care time)  Medications Ordered in UC Medications - No data to display  Initial Impression / Assessment and Plan / UC Course  I have reviewed the triage vital signs and the nursing notes.  Pertinent labs & imaging results that were available during my care of the patient were reviewed by me and considered in my medical decision making (see chart for details).     Chest x-ray obtained given recent  surgery and associated chest discomfort.  No pneumonia visualized on x-ray.  Likely viral URI symptoms.  Vital signs stable.  Will recommend continued symptomatic management.  Zyrtec and Mucinex for congestion.  Will hold off on Flonase given appearance of blood in the nose in order to avoid further drying.  Cough syrup as needed.  Discussed further over-the-counter symptomatic management.Discussed strict return precautions. Patient verbalized understanding and is agreeable with plan.  Final Clinical Impressions(s) / UC Diagnoses   Final diagnoses:  Viral URI with cough     Discharge Instructions     NO pneumonia You likely having a viral upper respiratory infection. We recommended symptom control. I expect your symptoms to start improving in the next 1-2 weeks.   1. Take a daily allergy pill/anti-histamine like Zyrtec, Claritin, or Store brand consistently for 2 weeks  2. For congestion you may try an oral decongestant like Mucinex or sudafed. You may also try intranasal flonase nasal spray or saline irrigations (neti pot, sinus cleanse)  3. For your sore throat you may try cepacol lozenges, salt water gargles, throat spray. Treatment of congestion may also help your sore throat.  4. For cough you may try Robitussen, Mucinex DM  5. Take Tylenol or Ibuprofen to help with pain/inflammation  6. Stay hydrated, drink plenty of fluids to keep throat coated and less irritated  Honey Tea For cough/sore throat try using a honey-based tea. Use 3 teaspoons of honey with juice squeezed from half lemon. Place shaved pieces of ginger into 1/2-1 cup of water and warm over stove top. Then mix the ingredients and repeat every 4 hours as needed.    ED Prescriptions  Medication Sig Dispense Auth. Provider   Cetirizine HCl 10 MG CAPS Take 1 capsule (10 mg total) by mouth daily for 10 days. 10 capsule Parissa Chiao C, PA-C   brompheniramine-pseudoephedrine-DM 30-2-10 MG/5ML syrup Take 5 mLs by  mouth 4 (four) times daily as needed. 120 mL Shaquisha Wynn C, PA-C     Controlled Substance Prescriptions [place] Controlled Substance Registry consulted? Not Applicable   Janith Lima, [place] 02/08/18 [phone]

## 2018-02-10 LAB — CULTURE, GROUP A STREP (THRC)

## 2018-02-25 DIAGNOSIS — E7800 Pure hypercholesterolemia, unspecified: Secondary | ICD-10-CM

## 2018-02-25 HISTORY — DX: Pure hypercholesterolemia, unspecified: E78.00

## 2018-06-02 ENCOUNTER — Telehealth: Payer: Self-pay | Admitting: Obstetrics and Gynecology

## 2018-06-02 NOTE — Telephone Encounter (Signed)
Spoke with patient's spouse [name], okay per ROI. [name] states that the patient has taken OTC Claritin for 5 days without relief. Patient is having itchy eyes/nose, runny nose, swollen/red eyes, and swelling. Feel this is related to pollen. Advised may take OTC benadryl as needed. May switch to taking OTC Zyrtec and using otc Flonase to see if this provides relief. If symptoms worsen or are not getting better with OTC options will need to be seen at a local Urgent Care or can contact Dr.Wallace's office to see if they can establish care and have a virtual visit. [name] verbalizes understanding.  Routing to provider and will close encounter.

## 2018-06-02 NOTE — Telephone Encounter (Addendum)
Patient's spouse [name] is calling to ask for a recommendation to treat his wife's severe pollen allergy. He states she is constantly sneezing and he gave her an otc allergy medication. She does not have a PCP and has only seen Dr.Silva.  dpr on fie to talk with [name].

## 2018-06-08 MED FILL — BETAMETHASONE DP 0.05% CRM: 0.05 | 30 days supply | Qty: 45 | Fill #0

## 2018-07-24 ENCOUNTER — Telehealth: Payer: Self-pay | Admitting: Obstetrics and Gynecology

## 2018-07-24 NOTE — Telephone Encounter (Signed)
Request to call patient came from the patient through [place] interpretor, [employer].   Husband is hospitalized at [facility].  She would like to understand what the treatment will be for her husband.   She needs [place] interpretors to be available.   She and their neighbor and friend are his support system.  She speaks [place] and some [place].  Their neighbor speaks [place] and some [place].   I discussed live interpretors that the hospital system can provide.  I also mentioned language line interpretors which are available 24 hours a day.   I offered further assistance if needed.  Patient expresses appreciation.

## 2019-02-05 ENCOUNTER — Other Ambulatory Visit: Payer: Self-pay | Admitting: Obstetrics and Gynecology

## 2019-02-05 DIAGNOSIS — Z1231 Encounter for screening mammogram for malignant neoplasm of breast: Secondary | ICD-10-CM

## 2019-04-19 ENCOUNTER — Ambulatory Visit: Payer: Self-pay | Attending: Family

## 2019-04-19 DIAGNOSIS — Z23 Encounter for immunization: Secondary | ICD-10-CM | POA: Insufficient documentation

## 2019-04-19 NOTE — Progress Notes (Signed)
   Covid-19 Vaccination Clinic  Name:  Crystal Gardner    MRN: [id number] DOB: June 11, 1965  04/19/2019  [name] was observed post Covid-19 immunization for 15 minutes without incidence. She was provided with Vaccine Information Sheet and instruction to access the V-Safe system.   [name] was instructed to call 911 with any severe reactions post vaccine: [name] Kitchen Difficulty breathing  . Swelling of your face and throat  . A fast heartbeat  . A bad rash all over your body  . Dizziness and weakness    Immunizations Administered    Name Date Dose VIS Date Route   Moderna COVID-19 Vaccine 04/19/2019  3:55 PM 0.5 mL 01/26/2019 Intramuscular   Manufacturer: Moderna   Lot: [id number]   [id number]

## 2019-05-18 ENCOUNTER — Ambulatory Visit: Payer: Self-pay | Attending: Family

## 2019-05-18 DIAGNOSIS — Z23 Encounter for immunization: Secondary | ICD-10-CM

## 2019-05-18 NOTE — Progress Notes (Signed)
   Covid-19 Vaccination Clinic  Name:  Crystal Gardner    MRN: [id number] DOB: 1965/10/05  05/18/2019  Crystal Gardner was observed post Covid-19 immunization for 15 minutes without incident. She was provided with Vaccine Information Sheet and instruction to access the V-Safe system.   Crystal Gardner was instructed to call 911 with any severe reactions post vaccine: [name] Kitchen Difficulty breathing  . Swelling of face and throat  . A fast heartbeat  . A bad rash all over body  . Dizziness and weakness   Immunizations Administered    Name Date Dose VIS Date Route   Moderna COVID-19 Vaccine 05/18/2019  4:32 PM 0.5 mL 01/26/2019 Intramuscular   Manufacturer: Moderna   Lot: [id number]   [place]:[id number]

## 2019-05-24 ENCOUNTER — Telehealth: Payer: Self-pay

## 2019-05-24 NOTE — Telephone Encounter (Signed)
Spoke with spouse, advised per Dr. Quincy Simmonds. Spouse verbalizes understanding and is agreeable.   Encounter closed.

## 2019-05-24 NOTE — Telephone Encounter (Signed)
Patients husband called in regards to patient having severe pain in right side and inside vaginal area. Patients husband also stated patient is having severe headaches.

## 2019-05-24 NOTE — Telephone Encounter (Signed)
I agree with your recommendations. 

## 2019-05-24 NOTE — Telephone Encounter (Signed)
Spoke with patients spouse, Crystal Gardner, ok per dpr. Patient reports "fine/sharp pain" 6/10 in right lower abd/pelvis and vagina. Pain started when she was lifting heavy objects, they are in the process of moving. Denies N/V, fever/chills, urinary symptoms, vaginal bleeding, vag d/c or odor. Reports normal BMS, abdomen soft, non distended. Last BM 05/23/19.   Patient received 2nd Covid 19 vaccine on 05/18/19. Spouse reports patient was nauseous later that day and "passed out and hit her head on the floor". Reports vomiting x1 and headache. Nausea and vomiting resolved by the next day, headache still present. Reports ears hurt, "feels like pins". Advised spouse patient needs to f/u with PCP/Urgent care/ER  for further evaluation of these symptoms. Patient does not have a PCP, spouse reports patient declines to see PCP. Reviewed options for PCP providers. Advised we can schedule OV for further evaluation of pelvic/vaginal pain, but patient does need to be evaluated by PCP/Urgent Care/ER for other symptoms, spouse verbalizes understanding. OV scheduled for 4/5 at 4:30pm with Crystal Gardner, spouse declined earlier appt offered. Advised I will review with Crystal Gardner and f/u with any additional recommendations. Spouse agreeable.   Routing to Crystal Gardner for final review.

## 2019-05-31 ENCOUNTER — Ambulatory Visit (INDEPENDENT_AMBULATORY_CARE_PROVIDER_SITE_OTHER): Payer: BC Managed Care – PPO | Admitting: Obstetrics and Gynecology

## 2019-05-31 ENCOUNTER — Other Ambulatory Visit: Payer: Self-pay

## 2019-05-31 ENCOUNTER — Encounter: Payer: Self-pay | Admitting: Obstetrics and Gynecology

## 2019-05-31 VITALS — BP 110/70 | HR 72 | Temp 97.9°F | Resp 10 | Ht 63.5 in | Wt 128.2 lb

## 2019-05-31 DIAGNOSIS — G8929 Other chronic pain: Secondary | ICD-10-CM

## 2019-05-31 DIAGNOSIS — R1031 Right lower quadrant pain: Secondary | ICD-10-CM

## 2019-05-31 NOTE — Progress Notes (Signed)
GYNECOLOGY  VISIT   HPI: 54 y.o.   Married  [place]  female   [phone] with Patient's last menstrual period was 06/18/2016 (exact date).   here for right sided pain. Patient states that she noticed the pain when she was working with her garden. Per patient, also has right side pain during intercourse sometimes.       Normal CT 05/09/17.   She has cramping like menstrual cramping on her right side and lower back pain.  She notices this with lifting.  When she is physically active in [place], she does not feel the pain.  Exercise does not cause her to have pain.  She is not interested in surgical evaluation.   The pain is affecting the quality of her life.   GYNECOLOGIC HISTORY: Patient's last menstrual period was 06/18/2016 (exact date). Contraception:  Hysterectomy Menopausal hormone therapy:  none Last mammogram:  08-07-16 Implants/Neg/density C/BiRads1 Last pap smear:   10/2015 Normal        OB History    Gravida  1   Para  1   Term  1   Preterm      AB      Living  1     SAB      TAB      Ectopic      Multiple      Live Births                 Patient Active Problem List   Diagnosis Date Noted  . Situational stress 01/28/2018    Past Medical History:  Diagnosis Date  . Anemia    childhood up until age 70 years old  . Asthma    up until 54 years old  . Dysmenorrhea   . Family history not obtainable due to adoption   . Family history of suicide    patient's son.  . Numbness and tingling of both feet   . Orthodontics    Invisalign, has metal post upper right side of teeth, temporary bridge right side, permanent bridge left side  . Right shoulder pain   . Varicose vein of leg     Past Surgical History:  Procedure Laterality Date  . AUGMENTATION MAMMAPLASTY  [id number]   silicone implants--[place]  . BLADDER SUSPENSION N/A 07/23/2016   Procedure: TRANSVAGINAL TAPE (TVT) PROCEDURE exact midurethral sling;  Surgeon: Nunzio Cobbs, MD;   Location: [facility];  Service: Gynecology;  Laterality: N/A;  . CATARACT EXTRACTION Right 2016   --in [place]  . CESAREAN SECTION    . CYSTOSCOPY N/A 07/23/2016   Procedure: CYSTOSCOPY;  Surgeon: Nunzio Cobbs, MD;  Location: [facility];  Service: Gynecology;  Laterality: N/A;  . TOTAL LAPAROSCOPIC HYSTERECTOMY WITH SALPINGECTOMY Bilateral 07/23/2016   Procedure: HYSTERECTOMY TOTAL LAPAROSCOPIC WITH SALPINGECTOMY collection of pelvic washings;  Surgeon: Nunzio Cobbs, MD;  Location: [facility];  Service: Gynecology;  Laterality: Bilateral;  3 hours OR time  . VARICOSE VEIN SURGERY  2017   in [place]    Current Outpatient Medications  Medication Sig Dispense Refill  . Cetirizine HCl 10 MG CAPS Take 1 capsule (10 mg total) by mouth daily for 10 days. 10 capsule 0  . Cholecalciferol (VITAMIN D PO) Take by mouth daily.    . diphenhydrAMINE (SOMINEX) 25 MG tablet Take by mouth.    . fluticasone (FLONASE) 50 MCG/ACT nasal spray Place into the nose.    . hydroxypropyl methylcellulose / hypromellose (  ISOPTO TEARS / GONIOVISC) 2.5 % ophthalmic solution Place 2 drops into both eyes as needed for dry eyes.    Javier Docker Oil (OMEGA-3) 500 MG CAPS Take 1,000 mg by mouth daily after breakfast.    . omega-3 acid ethyl esters (LOVAZA) 1 g capsule Take 2 capsules by mouth daily.    . brompheniramine-pseudoephedrine-DM 30-2-10 MG/5ML syrup Take 5 mLs by mouth 4 (four) times daily as needed. (Patient not taking: Reported on 05/31/2019) 120 mL 0   No current facility-administered medications for this visit.     ALLERGIES: Patient has no known allergies.  Family History  Adopted: Yes  Problem Relation Age of Onset  . Hypertension Mother   . Hypertension Father   . Hypertension Maternal Grandmother     Social History   Socioeconomic History  . Marital status: Married    Spouse name: Not on file  . Number of children: Not on file  . Years of education: Not on file  . Highest education level:  Not on file  Occupational History  . Not on file  Tobacco Use  . Smoking status: Never Smoker  . Smokeless tobacco: Never Used  Substance and Sexual Activity  . Alcohol use: Yes    Alcohol/week: 8.0 standard drinks    Types: 8 Glasses of wine per week    Comment: wine , beer  . Drug use: Yes    Types: Marijuana  . Sexual activity: Yes    Partners: Male    Birth control/protection: None, Surgical    Comment: Hyst  Other Topics Concern  . Not on file  Social History Narrative  . Not on file   Social Determinants of Health   Financial Resource Strain:   . Difficulty of Paying Living Expenses:   Food Insecurity:   . Worried About Charity fundraiser in the Last Year:   . Arboriculturist in the Last Year:   Transportation Needs:   . Film/video editor (Medical):   [name] Kitchen Lack of Transportation (Non-Medical):   Physical Activity:   . Days of Exercise per Week:   . Minutes of Exercise per Session:   Stress:   . Feeling of Stress :   Social Connections:   . Frequency of Communication with Friends and Family:   . Frequency of Social Gatherings with Friends and Family:   . Attends Religious Services:   . Active Member of Clubs or Organizations:   . Attends Archivist Meetings:   [name] Kitchen Marital Status:   Intimate Partner Violence:   . Fear of Current or Ex-Partner:   . Emotionally Abused:   [name] Kitchen Physically Abused:   . Sexually Abused:     Review of Systems  Constitutional: Negative.   HENT: Negative.   Eyes: Negative.   Respiratory: Negative.   Cardiovascular: Negative.   Gastrointestinal:       Right side pain  Endocrine: Negative.   Genitourinary: Negative.   Musculoskeletal: Negative.   Skin: Negative.   Allergic/Immunologic: Negative.   Neurological: Negative.   Hematological: Negative.   Psychiatric/Behavioral: Negative.     PHYSICAL EXAMINATION:    BP 110/70 (BP Location: Left Arm, Patient Position: Sitting, Cuff Size: Normal)   Pulse 72   Temp  97.9 F (36.6 C) (Temporal)   Resp 10   Ht 5' 3.5" (1.613 m)   Wt 128 lb 3.2 oz (58.2 kg)   LMP 06/18/2016 (Exact Date)   BMI 22.35 kg/m     General appearance:  alert, cooperative and appears stated age   Abdomen: soft, non-tender, no masses,  no organomegaly   Pelvic: External genitalia:  no lesions              Urethra:  normal appearing urethra with no masses, tenderness or lesions              Bartholins and Skenes: normal                 Vagina: normal appearing vagina with normal color and discharge, no lesions              Cervix: absent                Bimanual Exam:  Uterus:  absent              Adnexa: no mass, fullness, tenderness                Chaperone was present for exam.  ASSESSMENT  RLQ pain.  Chronic.  Right ovary remains.   PLAN  Pelvic US.  Referral to GI for potential colonoscopy.  We discussed musculoskeletal source of pain.  She understands that surgical care can be performed if her work up is negative and she would like to pursue this.    An After Visit Summary was printed and given to the patient.  __25____ minutes face to face time of which over 50% was spent in counseling.

## 2019-05-31 NOTE — Patient Instructions (Signed)
Loratadine capsules or tablets What is this medicine? LORATADINE (lor AT a deen) is an antihistamine. It helps to relieve sneezing, runny nose, and itchy, watery eyes. This medicine is used to treat the symptoms of allergies. It is also used to treat itchy skin rash and hives. This medicine may be used for other purposes; ask your health care provider or pharmacist if you have questions. COMMON BRAND NAME(S): Alavert, Allergy Relief, Claritin, Claritin Hives Relief, Claritin Liqui-Gel, Claritin-D 24 Hour, Clear-Atadine, QlearQuil All Day & All Night Allergy Relief, Tavist ND What should I tell my health care provider before I take this medicine? They need to know if you have any of these conditions:  asthma  kidney disease  liver disease  an unusual or allergic reaction to loratadine, other antihistamines, other medicines, foods, dyes, or preservatives  pregnant or trying to get pregnant  breast-feeding How should I use this medicine? Take this medicine by mouth with a glass of water. Follow the directions on the label. You may take this medicine with food or on an empty stomach. Take your medicine at regular intervals. Do not take your medicine more often than directed. Talk to your pediatrician regarding the use of this medicine in children. While this medicine may be used in children as young as 6 years for selected conditions, precautions do apply. Overdosage: If you think you have taken too much of this medicine contact a poison control center or emergency room at once. NOTE: This medicine is only for you. Do not share this medicine with others. What if I miss a dose? If you miss a dose, take it as soon as you can. If it is almost time for your next dose, take only that dose. Do not take double or extra doses. What may interact with this medicine?  other medicines for colds or allergies This list may not describe all possible interactions. Give your health care provider a list  of all the medicines, herbs, non-prescription drugs, or dietary supplements you use. Also tell them if you smoke, drink alcohol, or use illegal drugs. Some items may interact with your medicine. What should I watch for while using this medicine? Tell your doctor or healthcare professional if your symptoms do not start to get better or if they get worse. Your mouth may get dry. Chewing sugarless gum or sucking hard candy, and drinking plenty of water may help. Contact your doctor if the problem does not go away or is severe. You may get drowsy or dizzy. Do not drive, use machinery, or do anything that needs mental alertness until you know how this medicine affects you. Do not stand or sit up quickly, especially if you are an older patient. This reduces the risk of dizzy or fainting spells. What side effects may I notice from receiving this medicine? Side effects that you should report to your doctor or health care professional as soon as possible:  allergic reactions like skin rash, itching or hives, swelling of the face, lips, or tongue  breathing problems  unusually restless or nervous Side effects that usually do not require medical attention (report to your doctor or health care professional if they continue or are bothersome):  drowsiness  dry or irritated mouth or throat  headache This list may not describe all possible side effects. Call your doctor for medical advice about side effects. You may report side effects to FDA at 1-800-FDA-1088. Where should I keep my medicine? Keep out of the reach of children.  Store at room temperature between 2 and 30 degrees C (36 and 86 degrees F). Protect from moisture. Throw away any unused medicine after the expiration date. NOTE: This sheet is a summary. It may not cover all possible information. If you have questions about this medicine, talk to your doctor, pharmacist, or health care provider.  2020 Elsevier/Gold Standard (2007-08-17  17:17:24)

## 2019-06-01 ENCOUNTER — Telehealth: Payer: Self-pay | Admitting: Obstetrics and Gynecology

## 2019-06-01 ENCOUNTER — Ambulatory Visit: Payer: Self-pay

## 2019-06-01 NOTE — Telephone Encounter (Signed)
Call to patient. Per DPR, OK to leave message on voicemail.   Left voicemail requesting a return call to Hayley to review benefits and schedule recommended Pelvic ultrasound with Brook A. Silva, MD, FACOG 

## 2019-06-07 NOTE — Telephone Encounter (Signed)
Spoke with patient's husband, [name] (OK per [facility]) with patient present regarding benefits for recommended ultrasound. Patient is aware that ultrasound is transvaginal. Patient acknowledges understanding of information presented. Patient is aware of cancellation policy. Patient is scheduled for 06/24/2019 with Dr. Quincy Simmonds. Encounter closed.

## 2019-06-07 NOTE — Telephone Encounter (Signed)
Call to patient. Per DPR, OK to leave message on voicemail.   Left voicemail requesting a return call to Hayley to review benefits and schedule recommended Pelvic ultrasound with Brook A. Silva, MD, FACOG 

## 2019-06-08 ENCOUNTER — Telehealth: Payer: Self-pay | Admitting: Obstetrics and Gynecology

## 2019-06-08 NOTE — Telephone Encounter (Signed)
Left message on voicemail to call and reschedule cancelled appointment. °

## 2019-06-14 ENCOUNTER — Ambulatory Visit: Payer: BLUE CROSS/BLUE SHIELD | Admitting: Obstetrics and Gynecology

## 2019-06-24 ENCOUNTER — Ambulatory Visit (INDEPENDENT_AMBULATORY_CARE_PROVIDER_SITE_OTHER): Payer: BC Managed Care – PPO | Admitting: Obstetrics and Gynecology

## 2019-06-24 ENCOUNTER — Ambulatory Visit (INDEPENDENT_AMBULATORY_CARE_PROVIDER_SITE_OTHER): Payer: BC Managed Care – PPO

## 2019-06-24 ENCOUNTER — Other Ambulatory Visit: Payer: Self-pay

## 2019-06-24 ENCOUNTER — Encounter: Payer: Self-pay | Admitting: Obstetrics and Gynecology

## 2019-06-24 VITALS — BP 144/98 | HR 88 | Temp 97.6°F | Ht 63.5 in | Wt 133.4 lb

## 2019-06-24 DIAGNOSIS — R1031 Right lower quadrant pain: Secondary | ICD-10-CM | POA: Diagnosis not present

## 2019-06-24 DIAGNOSIS — Z1211 Encounter for screening for malignant neoplasm of colon: Secondary | ICD-10-CM

## 2019-06-24 DIAGNOSIS — G8929 Other chronic pain: Secondary | ICD-10-CM | POA: Diagnosis not present

## 2019-06-24 NOTE — Progress Notes (Signed)
GYNECOLOGY  VISIT   HPI: 54 y.o.   Married  [place]  female   [phone] with Patient's last menstrual period was 06/18/2016 (exact date).   here for pelvic ultrasound for chronic right sided pelvic pain.    [place] interpretor [employer] present.  States the pain is much better.  States that the pain is increased when she is lifting something. The pain starts in her leg ina vein and extends up to her right abdomen. The pain occurs twice a day.  When she eats fiber, the pain is improved.   Colonoscopy has not been done to date.  Normal CT 05/09/17.   [place] 83.8 on 10/03/17.  GYNECOLOGIC HISTORY: Patient's last menstrual period was 06/18/2016 (exact date). Contraception: Hyst Menopausal hormone therapy:  none Last mammogram: 08-07-16 Implants/Neg/density C/BiRads1 Last pap smear: 10/2015 normal per patient        OB History    Gravida  1   Para  1   Term  1   Preterm      AB      Living  1     SAB      TAB      Ectopic      Multiple      Live Births                 Patient Active Problem List   Diagnosis Date Noted  . Situational stress 01/28/2018    Past Medical History:  Diagnosis Date  . Anemia    childhood up until age 52 years old  . Asthma    up until 54 years old  . Dysmenorrhea   . Family history not obtainable due to adoption   . Family history of suicide    patient's son.  . Numbness and tingling of both feet   . Orthodontics    Invisalign, has metal post upper right side of teeth, temporary bridge right side, permanent bridge left side  . Right shoulder pain   . Varicose vein of leg     Past Surgical History:  Procedure Laterality Date  . AUGMENTATION MAMMAPLASTY  [id number]   silicone implants--[place]  . BLADDER SUSPENSION N/A 07/23/2016   Procedure: TRANSVAGINAL TAPE (TVT) PROCEDURE exact midurethral sling;  Surgeon: Nunzio Cobbs, MD;  Location: [facility];  Service: Gynecology;  Laterality: N/A;  . CATARACT EXTRACTION  Right 2016   --in [place]  . CESAREAN SECTION    . CYSTOSCOPY N/A 07/23/2016   Procedure: CYSTOSCOPY;  Surgeon: Nunzio Cobbs, MD;  Location: [facility];  Service: Gynecology;  Laterality: N/A;  . TOTAL LAPAROSCOPIC HYSTERECTOMY WITH SALPINGECTOMY Bilateral 07/23/2016   Procedure: HYSTERECTOMY TOTAL LAPAROSCOPIC WITH SALPINGECTOMY collection of pelvic washings;  Surgeon: Nunzio Cobbs, MD;  Location: [facility];  Service: Gynecology;  Laterality: Bilateral;  3 hours OR time  . VARICOSE VEIN SURGERY  2017   in [place]    Current Outpatient Medications  Medication Sig Dispense Refill  . brompheniramine-pseudoephedrine-DM 30-2-10 MG/5ML syrup Take 5 mLs by mouth 4 (four) times daily as needed. 120 mL 0  . Cholecalciferol (VITAMIN D PO) Take by mouth daily.    . diphenhydrAMINE (SOMINEX) 25 MG tablet Take by mouth.    . fluticasone (FLONASE) 50 MCG/ACT nasal spray Place into the nose.    . hydroxypropyl methylcellulose / hypromellose (ISOPTO TEARS / GONIOVISC) 2.5 % ophthalmic solution Place 2 drops into both eyes as needed for dry eyes.    [name] Kitchen  Krill Oil (OMEGA-3) 500 MG CAPS Take 1,000 mg by mouth daily after breakfast.    . omega-3 acid ethyl esters (LOVAZA) 1 g capsule Take 2 capsules by mouth daily.    . Cetirizine HCl 10 MG CAPS Take 1 capsule (10 mg total) by mouth daily for 10 days. 10 capsule 0   No current facility-administered medications for this visit.     ALLERGIES: Patient has no known allergies.  Family History  Adopted: Yes  Problem Relation Age of Onset  . Hypertension Mother   . Hypertension Father   . Hypertension Maternal Grandmother     Social History   Socioeconomic History  . Marital status: Married    Spouse name: Not on file  . Number of children: Not on file  . Years of education: Not on file  . Highest education level: Not on file  Occupational History  . Not on file  Tobacco Use  . Smoking status: Never Smoker  . Smokeless tobacco:  Never Used  Substance and Sexual Activity  . Alcohol use: Yes    Alcohol/week: 8.0 standard drinks    Types: 8 Glasses of wine per week    Comment: wine , beer  . Drug use: Yes    Types: Marijuana  . Sexual activity: Yes    Partners: Male    Birth control/protection: None, Surgical    Comment: Hyst  Other Topics Concern  . Not on file  Social History Narrative  . Not on file   Social Determinants of Health   Financial Resource Strain:   . Difficulty of Paying Living Expenses:   Food Insecurity:   . Worried About Charity fundraiser in the Last Year:   . Arboriculturist in the Last Year:   Transportation Needs:   . Film/video editor (Medical):   [name] Kitchen Lack of Transportation (Non-Medical):   Physical Activity:   . Days of Exercise per Week:   . Minutes of Exercise per Session:   Stress:   . Feeling of Stress :   Social Connections:   . Frequency of Communication with Friends and Family:   . Frequency of Social Gatherings with Friends and Family:   . Attends Religious Services:   . Active Member of Clubs or Organizations:   . Attends Archivist Meetings:   [name] Kitchen Marital Status:   Intimate Partner Violence:   . Fear of Current or Ex-Partner:   . Emotionally Abused:   [name] Kitchen Physically Abused:   . Sexually Abused:     Review of Systems  Genitourinary: Positive for pelvic pain.  All other systems reviewed and are negative.   PHYSICAL EXAMINATION:    BP (!) 144/98   Pulse 88   Temp 97.6 F (36.4 C) (Temporal)   Ht 5' 3.5" (1.613 m)   Wt 133 lb 6.4 oz (60.5 kg)   LMP 06/18/2016 (Exact Date)   BMI 23.26 kg/m     General appearance: alert, cooperative and appears stated age  Pelvic US Uterus absent. Right ovary with 13 mm follicle.  Left ovary absent.  No free fluid.  ASSESSMENT  Chronic RLQ/right midquadrant pain.   PLAN  Reassurance regarding normal ultrasound findings.  I reviewed that she had minor scar tissue in the right side of her abdomen  near her cecum at the time of hysterectomy, and that it was not removed because she was not having symptoms at that time. To GI for evaluation and colonoscopy.  She will schedule  her mammogram.   An After Visit Summary was printed and given to the patient.  ___20___ minutes face to face time of which over 50% was spent in counseling.

## 2019-06-28 ENCOUNTER — Encounter: Payer: Self-pay | Admitting: Gastroenterology

## 2019-07-27 ENCOUNTER — Ambulatory Visit: Payer: Self-pay | Admitting: Gastroenterology

## 2019-09-17 ENCOUNTER — Ambulatory Visit: Payer: BC Managed Care – PPO | Admitting: Gastroenterology

## 2019-11-19 ENCOUNTER — Ambulatory Visit
Admission: RE | Admit: 2019-11-19 | Discharge: 2019-11-19 | Disposition: A | Discharge status: Home / Self Care | Payer: BC Managed Care – PPO | Source: Ambulatory Visit | Attending: Internal Medicine | Admitting: Internal Medicine

## 2019-11-19 ENCOUNTER — Other Ambulatory Visit: Payer: Self-pay | Admitting: Internal Medicine

## 2019-11-19 DIAGNOSIS — R059 Cough, unspecified: Secondary | ICD-10-CM

## 2019-12-09 NOTE — Progress Notes (Signed)
54 y.o. G105P1001 Married [place] female here for annual exam.    Has elevated cholesterol.  Changed her diet and her right lower quadrant pain resolved.  Lost 3 kilos.   She is happy with her bladder surgery.  Good control.   Some insomnia.  Taking vallarian root.  Night sweats.  Tolerating them ok.   She would like to have her breast implants reduced in size.   Did Covid vaccine, Moderna, in August, 2021.   PCP:   Josetta Huddle, MD  Patient's last menstrual period was 06/18/2016 (exact date).           Sexually active: Yes.    The current method of family planning is status post hysterectomy.    Exercising: Yes.    cycling daily Smoker:  no  Health Maintenance: Pap:  10/2015 normal per patient in [place] History of abnormal Pap:  no MMG: 08/07/16 BIRADS 1 negative/density c Colonoscopy:  NEVER BMD:  n/a  Result  n/a TDaP:  July, 2021.  Gardasil:   no HIV: Neg in [place] Hep C: Neg in [place] Screening Labs:  PCP   reports that she has never smoked. She has never used smokeless tobacco. She reports current alcohol use of about 10.0 standard drinks of alcohol per week. She reports current drug use. Frequency: 2.00 times per week. Drug: Marijuana.  Past Medical History:  Diagnosis Date  . Anemia    childhood up until age 16 years old  . Asthma    up until 54 years old  . Dysmenorrhea   . Family history not obtainable due to adoption   . Family history of suicide    patient's son.  . Hypercholesteremia 2020  . Numbness and tingling of both feet   . Orthodontics    Invisalign, has metal post upper right side of teeth, temporary bridge right side, permanent bridge left side  . Right shoulder pain   . Varicose vein of leg     Past Surgical History:  Procedure Laterality Date  . AUGMENTATION MAMMAPLASTY  32/9518   silicone implants--[place]  . BLADDER SUSPENSION N/A 07/23/2016   Procedure: TRANSVAGINAL TAPE (TVT) PROCEDURE exact midurethral sling;  Surgeon: Nunzio Cobbs, MD;  Location: [facility];  Service: Gynecology;  Laterality: N/A;  . CATARACT EXTRACTION Right 2016   --in [place]  . CESAREAN SECTION    . CYSTOSCOPY N/A 07/23/2016   Procedure: CYSTOSCOPY;  Surgeon: Nunzio Cobbs, MD;  Location: [facility];  Service: Gynecology;  Laterality: N/A;  . TOTAL LAPAROSCOPIC HYSTERECTOMY WITH SALPINGECTOMY Bilateral 07/23/2016   Procedure: HYSTERECTOMY TOTAL LAPAROSCOPIC WITH SALPINGECTOMY collection of pelvic washings;  Surgeon: Nunzio Cobbs, MD;  Location: [facility];  Service: Gynecology;  Laterality: Bilateral;  3 hours OR time  . VARICOSE VEIN SURGERY  2017   in [place]    Current Outpatient Medications  Medication Sig Dispense Refill  . Cholecalciferol (VITAMIN D PO) Take by mouth daily.    . fluticasone (FLONASE) 50 MCG/ACT nasal spray Place into the nose.    . hydroxypropyl methylcellulose / hypromellose (ISOPTO TEARS / GONIOVISC) 2.5 % ophthalmic solution Place 2 drops into both eyes as needed for dry eyes.    Javier Docker Oil (OMEGA-3) 500 MG CAPS Take 1,000 mg by mouth daily after breakfast.    . UNABLE TO FIND Med Name: Shelbie Proctor based --takes for sleep    . Cetirizine HCl 10 MG CAPS Take 1 capsule (10 mg total) by  mouth daily for 10 days. 10 capsule 0   No current facility-administered medications for this visit.    Family History  Adopted: Yes  Problem Relation Age of Onset  . Hypertension Mother   . Hypertension Father   . Hypertension Maternal Grandmother     Review of Systems  All other systems reviewed and are negative.   Exam:   BP 112/70   Pulse 100   Ht 5' 3.75" (1.619 m)   Wt 129 lb (58.5 kg)   LMP 06/18/2016 (Exact Date)   SpO2 97%   BMI 22.32 kg/m     General appearance: alert, cooperative and appears stated age Head: normocephalic, without obvious abnormality, atraumatic Neck: no adenopathy, supple, symmetrical, trachea midline and thyroid normal to inspection and palpation Lungs: clear to  auscultation bilaterally Breasts: normal appearance, bilateral implants, no masses or tenderness, No nipple retraction or dimpling, No nipple discharge or bleeding, No axillary adenopathy Heart: regular rate and rhythm Abdomen: soft, non-tender; no masses, no organomegaly Extremities: extremities normal, atraumatic, no cyanosis or edema Skin: skin color, texture, turgor normal. No rashes or lesions Lymph nodes: cervical, supraclavicular, and axillary nodes normal. Neurologic: grossly normal  Pelvic: External genitalia:  no lesions              No abnormal inguinal nodes palpated.              Urethra:  normal appearing urethra with no masses, tenderness or lesions              Bartholins and Skenes: normal                 Vagina: normal appearing vagina with normal color and discharge, no lesions              Cervix: absent              Pap taken: No. Bimanual Exam:  Uterus: absent              Adnexa: no mass, fullness, tenderness              Rectal exam: Yes.  .  Confirms.              Anus:  normal sphincter tone, no lesions  Chaperone was present for exam.  Assessment:   Well woman visit with normal exam. Total laparoscopic hysterectomy with bilateral salpingectomy, left oophorectomy, collection of pelvic washings, TVT Exact midurethral sling, and cystoscopy. Hx chronic RLQ pain.   Resolved with diet change.  Status post bilateral breast augmentation.  Elevated cholesterol.  Plan: Mammogram screening discussed. Self breast awareness reviewed. Pap and HR HPV as above. Guidelines for Calcium, Vitamin D, regular exercise program including cardiovascular and weight bearing exercise. Referral to Plastic surgery.  Referral to gastroentologist.  Labs with PCP.  Follow up annually and prn.

## 2019-12-13 ENCOUNTER — Ambulatory Visit (INDEPENDENT_AMBULATORY_CARE_PROVIDER_SITE_OTHER): Payer: BC Managed Care – PPO | Admitting: Obstetrics and Gynecology

## 2019-12-13 ENCOUNTER — Encounter: Payer: Self-pay | Admitting: Obstetrics and Gynecology

## 2019-12-13 ENCOUNTER — Other Ambulatory Visit: Payer: Self-pay

## 2019-12-13 VITALS — BP 112/70 | HR 100 | Ht 63.75 in | Wt 129.0 lb

## 2019-12-13 DIAGNOSIS — Z9882 Breast implant status: Secondary | ICD-10-CM | POA: Diagnosis not present

## 2019-12-13 DIAGNOSIS — Z1211 Encounter for screening for malignant neoplasm of colon: Secondary | ICD-10-CM

## 2019-12-13 DIAGNOSIS — Z01419 Encounter for gynecological examination (general) (routine) without abnormal findings: Secondary | ICD-10-CM | POA: Diagnosis not present

## 2019-12-13 NOTE — Patient Instructions (Signed)

## 2020-02-15 ENCOUNTER — Institutional Professional Consult (permissible substitution): Payer: BC Managed Care – PPO | Admitting: Plastic Surgery

## 2020-05-31 ENCOUNTER — Other Ambulatory Visit (HOSPITAL_COMMUNITY): Payer: Self-pay

## 2020-05-31 MED ORDER — CETIRIZINE HCL 10 MG PO TABS
10.0000 mg | ORAL_TABLET | Freq: Every day | ORAL | 1 refills | Status: DC
  Filled 2020-05-31: qty 90, 90d supply, fill #0

## 2020-05-31 MED ORDER — PREDNISONE 5 MG PO TABS
ORAL_TABLET | ORAL | 1 refills | Status: DC
  Filled 2020-05-31: qty 45, 15d supply, fill #0

## 2020-05-31 MED ORDER — FLUTICASONE PROPIONATE 50 MCG/ACT NA SUSP
NASAL | 5 refills | Status: DC
  Filled 2020-05-31: qty 16, 30d supply, fill #0

## 2020-05-31 MED ORDER — MONTELUKAST SODIUM 10 MG PO TABS
10.0000 mg | ORAL_TABLET | Freq: Every day | ORAL | 2 refills | Status: DC
  Filled 2020-05-31: qty 30, 30d supply, fill #0

## 2020-06-05 ENCOUNTER — Other Ambulatory Visit (HOSPITAL_COMMUNITY): Payer: Self-pay

## 2020-06-05 MED ORDER — ROSUVASTATIN CALCIUM 10 MG PO TABS
10.0000 mg | ORAL_TABLET | Freq: Every day | ORAL | 0 refills | Status: DC
  Filled 2020-06-05: qty 30, 30d supply, fill #0

## 2020-07-12 ENCOUNTER — Other Ambulatory Visit (HOSPITAL_COMMUNITY): Payer: Self-pay

## 2020-07-12 MED ORDER — ROSUVASTATIN CALCIUM 10 MG PO TABS
ORAL_TABLET | ORAL | 0 refills | Status: DC
  Filled 2020-07-12: qty 30, 30d supply, fill #0

## 2020-07-13 ENCOUNTER — Other Ambulatory Visit (HOSPITAL_COMMUNITY): Payer: Self-pay

## 2020-07-14 ENCOUNTER — Other Ambulatory Visit (HOSPITAL_COMMUNITY): Payer: Self-pay

## 2020-07-14 MED ORDER — ROSUVASTATIN CALCIUM 10 MG PO TABS
ORAL_TABLET | ORAL | 2 refills | Status: AC
  Filled 2020-07-14: qty 90, 90d supply, fill #0

## 2020-07-14 MED ORDER — AMOXICILLIN-POT CLAVULANATE 875-125 MG PO TABS
ORAL_TABLET | ORAL | 0 refills | Status: DC
  Filled 2020-07-14: qty 20, 10d supply, fill #0

## 2020-07-25 ENCOUNTER — Other Ambulatory Visit (HOSPITAL_COMMUNITY): Payer: Self-pay

## 2020-07-25 MED ORDER — PAXLOVID 20 X 150 MG & 10 X 100MG PO TBPK
ORAL_TABLET | ORAL | 0 refills | Status: DC
  Filled 2020-07-25: qty 30, 5d supply, fill #0

## 2020-07-26 ENCOUNTER — Other Ambulatory Visit (HOSPITAL_COMMUNITY): Payer: Self-pay

## 2020-07-28 ENCOUNTER — Other Ambulatory Visit (HOSPITAL_COMMUNITY): Payer: Self-pay

## 2020-11-27 ENCOUNTER — Other Ambulatory Visit (HOSPITAL_COMMUNITY): Payer: Self-pay

## 2021-02-12 ENCOUNTER — Other Ambulatory Visit: Payer: Self-pay | Admitting: Physician Assistant

## 2021-02-12 DIAGNOSIS — E7800 Pure hypercholesterolemia, unspecified: Secondary | ICD-10-CM

## 2021-02-12 DIAGNOSIS — R0789 Other chest pain: Secondary | ICD-10-CM

## 2021-04-13 IMAGING — CR DG CHEST 2V
2 series · 2 of 2 positions shown · non-contrast
Comparison: 02/07/2018

CLINICAL DATA: Cough.

EXAM:
CHEST - 2 VIEW

[w chest pa]
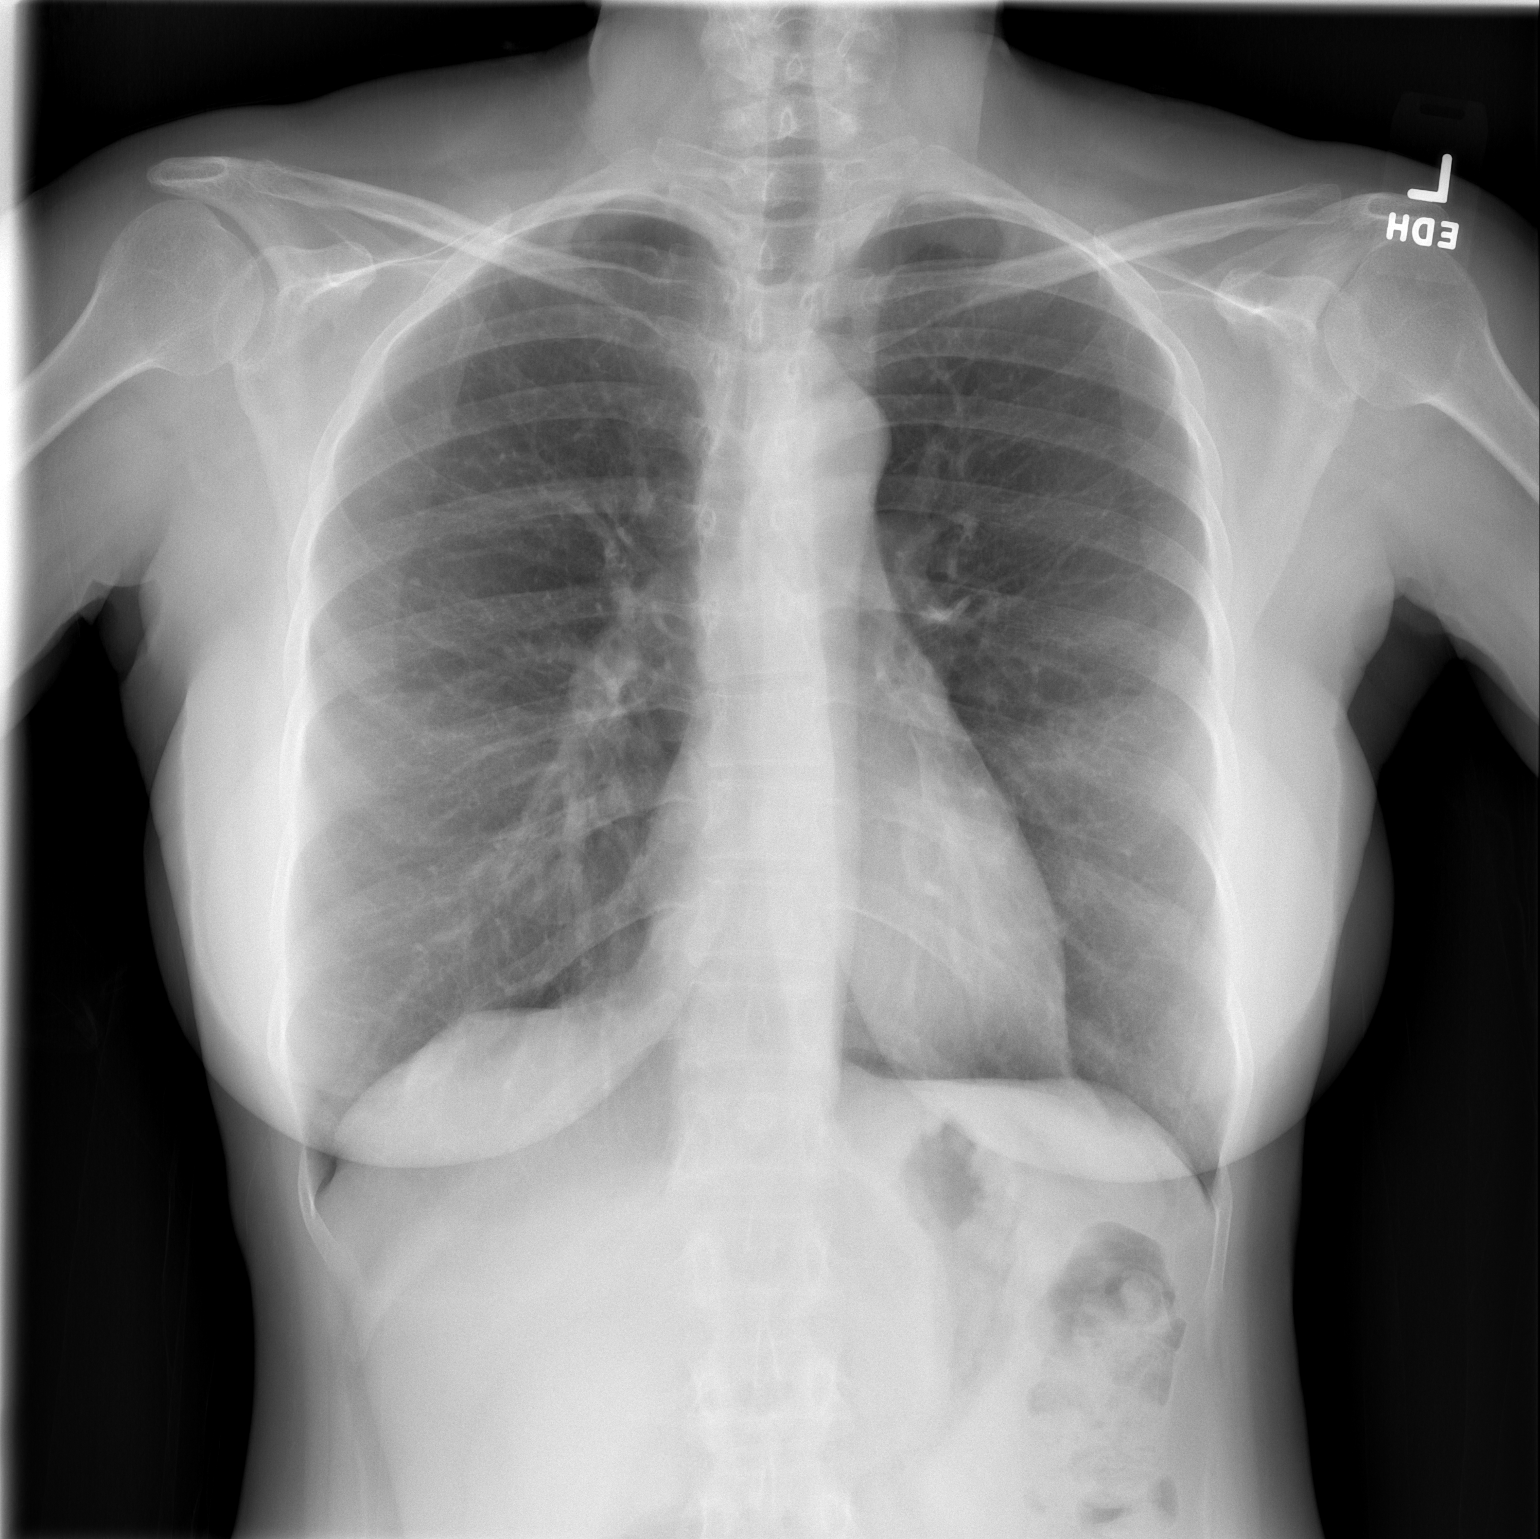

[w chest lat]
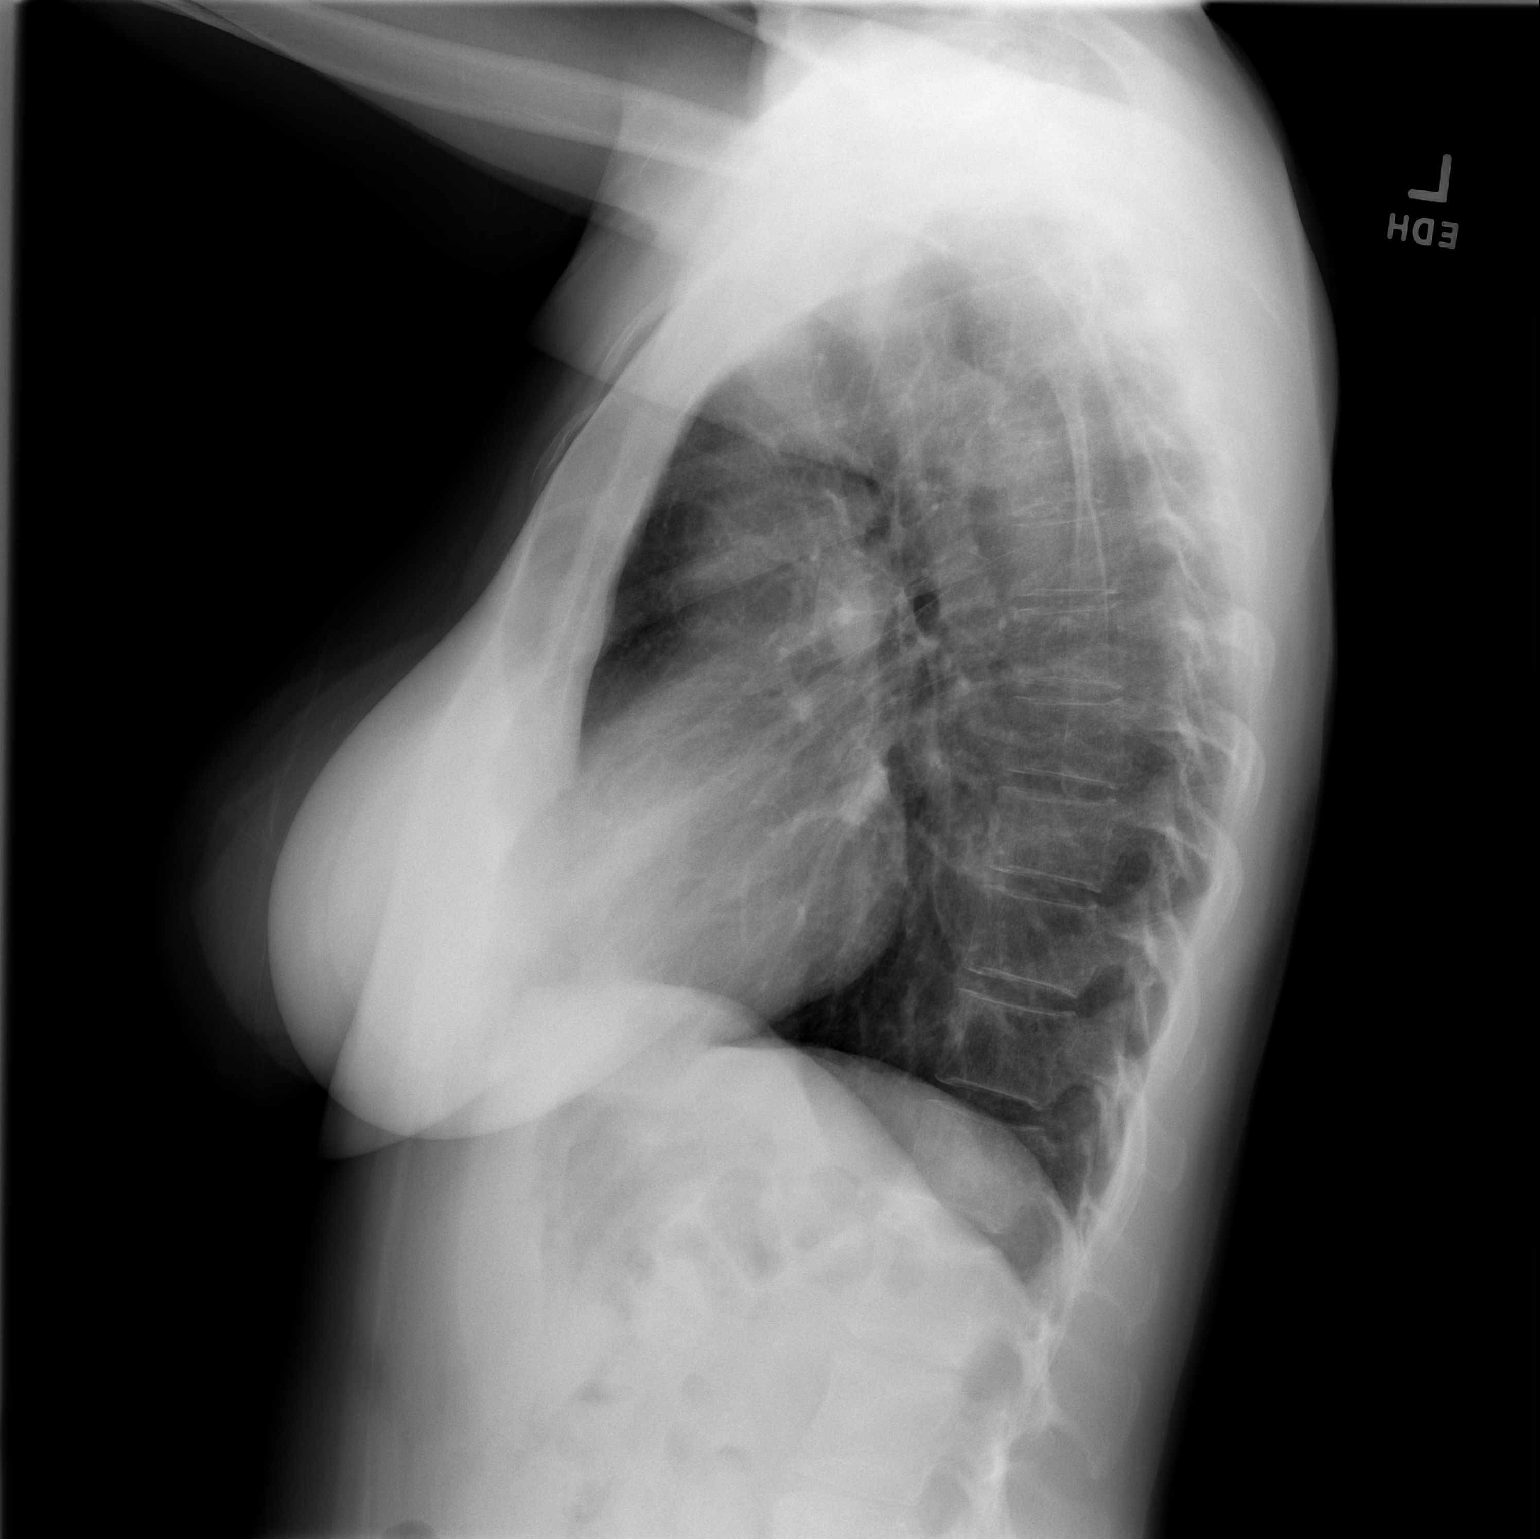

[2 of 2 positions shown; findings below may reference images not displayed]

FINDINGS: The heart size and mediastinal contours are within normal limits.
Both lungs are clear. The visualized skeletal structures are
unremarkable.
IMPRESSION: No acute cardiopulmonary disease.

## 2021-08-30 NOTE — Progress Notes (Unsigned)
56 y.o. G39P1001 Married [place] female here for annual exam.    Has thyroid nodules and slightly low T4.  Would like referral to endocrinologist.   She brings in exams/imaging studies from [place] for me to review.  Copies are made and will be scanned in to Epic.   Spending time in [place] and in [place].  Dealing with family deaths.   PCP:  None  Patient's last menstrual period was 06/18/2016 (exact date).           Sexually active: Yes.    The current method of family planning is status post hysterectomy.    Exercising: Yes.     Biking and walking Smoker:  no  Health Maintenance: Pap:    10/2015 normal per patient in [place] History of abnormal Pap:  no MMG:  02/2021 normal in [place] Colonoscopy:  NEVER BMD:   n/a  Result  n/a TDaP:  08/2019 Gardasil:   no HIV: Neg in [place] Hep C: Neg in [place]  Screening Labs:  PCP and [place].    reports that she has never smoked. She has never used smokeless tobacco. She reports current alcohol use of about 14.0 standard drinks of alcohol per week. She reports current drug use. Frequency: 2.00 times per week. Drug: Marijuana.  Past Medical History:  Diagnosis Date   Anemia    childhood up until age 86 years old   Asthma    up until 56 years old   Dysmenorrhea    Family history not obtainable due to adoption    Family history of suicide    patient's son.   Hypercholesteremia 2020   Numbness and tingling of both feet    Orthodontics    Invisalign, has metal post upper right side of teeth, temporary bridge right side, permanent bridge left side   Right shoulder pain    Varicose vein of leg     Past Surgical History:  Procedure Laterality Date   AUGMENTATION MAMMAPLASTY  37/9024   silicone implants--[place]   BLADDER SUSPENSION N/A 07/23/2016   Procedure: TRANSVAGINAL TAPE (TVT) PROCEDURE exact midurethral sling;  Surgeon: Nunzio Cobbs, MD;  Location: [facility];  Service: Gynecology;  Laterality: N/A;   CATARACT  EXTRACTION Right 2016   --in [place]   CESAREAN SECTION     CYSTOSCOPY N/A 07/23/2016   Procedure: CYSTOSCOPY;  Surgeon: Nunzio Cobbs, MD;  Location: [facility];  Service: Gynecology;  Laterality: N/A;   TOTAL LAPAROSCOPIC HYSTERECTOMY WITH SALPINGECTOMY Bilateral 07/23/2016   Procedure: HYSTERECTOMY TOTAL LAPAROSCOPIC WITH SALPINGECTOMY collection of pelvic washings;  Surgeon: Nunzio Cobbs, MD;  Location: [facility];  Service: Gynecology;  Laterality: Bilateral;  3 hours OR time   VARICOSE VEIN SURGERY  2017   in [place]    Current Outpatient Medications  Medication Sig Dispense Refill   Cholecalciferol (VITAMIN D PO) Take by mouth daily.     hydroxypropyl methylcellulose / hypromellose (ISOPTO TEARS / GONIOVISC) 2.5 % ophthalmic solution Place 2 drops into both eyes as needed for dry eyes.     Krill Oil (OMEGA-3) 500 MG CAPS Take 1,000 mg by mouth daily after breakfast.     rosuvastatin (CRESTOR) 10 MG tablet Take 1 tablet by mouth once a day 90 tablet 2   No current facility-administered medications for this visit.    Family History  Adopted: Yes  Problem Relation Age of Onset   Hypertension Mother    Hypertension Father    Hypertension  Maternal Grandmother     Review of Systems  All other systems reviewed and are negative.   Exam:   BP (!) 152/90   Pulse 60   Ht '5\' 4"'$  (1.626 m)   Wt 123 lb (55.8 kg)   LMP 06/18/2016 (Exact Date)   SpO2 97%   BMI 21.11 kg/m     General appearance: alert, cooperative and appears stated age Head: normocephalic, without obvious abnormality, atraumatic Neck: no adenopathy, supple, symmetrical, trachea midline and thyroid normal to inspection and palpation Lungs: clear to auscultation bilaterally Breasts: bilateral implants, no masses or tenderness, No nipple retraction or dimpling, No nipple discharge or bleeding, No axillary adenopathy Heart: regular rate and rhythm Abdomen: soft, non-tender; no masses, no  organomegaly Extremities: extremities normal, atraumatic, no cyanosis or edema Skin: skin color, texture, turgor normal. No rashes or lesions Lymph nodes: cervical, supraclavicular, and axillary nodes normal. Neurologic: grossly normal  Pelvic: External genitalia:  no lesions              No abnormal inguinal nodes palpated.              Urethra:  normal appearing urethra with no masses, tenderness or lesions              Bartholins and Skenes: normal                 Vagina: normal appearing vagina with normal color and discharge, no lesions              Cervix: absent              Pap taken: no Bimanual Exam:  Uterus:  absent              Adnexa: no mass, fullness, tenderness              Rectal exam: yes.  Confirms.              Anus:  normal sphincter tone, no lesions  Chaperone was present for exam:  Eustace Pen, CMA  Assessment:   Well woman visit with gynecologic exam. Status post total laparoscopic hysterectomy with bilateral salpingectomy, left oophorectomy, collection of pelvic washings, TVT Exact midurethral sling, and cystoscopy. Hx chronic RLQ pain.     Colon cancer screening. Status post bilateral breast augmentation.  Thyroid nodules.  Slightly low T4.   Plan: Mammogram screening discussed. Self breast awareness reviewed. Pap and HR HPV as above. Guidelines for Calcium, Vitamin D, regular exercise program including cardiovascular and weight bearing exercise.   Referral to Dr. Cruzita Lederer.   Referral to Dr. Collene Mares.   Follow up annually and prn.   After visit summary provided.

## 2021-08-31 ENCOUNTER — Ambulatory Visit (INDEPENDENT_AMBULATORY_CARE_PROVIDER_SITE_OTHER): Payer: BC Managed Care – PPO | Admitting: Obstetrics and Gynecology

## 2021-08-31 ENCOUNTER — Encounter: Payer: Self-pay | Admitting: Obstetrics and Gynecology

## 2021-08-31 VITALS — BP 152/90 | HR 60 | Ht 64.0 in | Wt 123.0 lb

## 2021-08-31 DIAGNOSIS — E042 Nontoxic multinodular goiter: Secondary | ICD-10-CM | POA: Diagnosis not present

## 2021-08-31 DIAGNOSIS — Z01419 Encounter for gynecological examination (general) (routine) without abnormal findings: Secondary | ICD-10-CM

## 2021-08-31 DIAGNOSIS — Z1211 Encounter for screening for malignant neoplasm of colon: Secondary | ICD-10-CM

## 2021-08-31 NOTE — Patient Instructions (Signed)

## 2021-09-02 ENCOUNTER — Telehealth: Payer: Self-pay | Admitting: Obstetrics and Gynecology

## 2021-09-02 NOTE — Telephone Encounter (Signed)
Please assist in referrals for my patient.  I have placed orders.   I made a referral for GI with Dr. Tyrone Sage for colonoscopy for colon cancer screening.  I also made a referral for endocrinology with Dr. Cruzita Lederer for thyroid nodules. Patient had ultrasound and labs done in [place].  She has reports for this.

## 2021-09-03 ENCOUNTER — Other Ambulatory Visit: Payer: Self-pay

## 2021-09-03 NOTE — Telephone Encounter (Signed)
Referral faxed to Dr. Collene Mares for screening colonoscopy.

## 2021-09-04 NOTE — Telephone Encounter (Signed)
I called Dr. Arman Filter office to schedule. Dr. Cruzita Lederer is not taking any new patients. Dr. Loanne Drilling has retired and Dr. Kelton Pillar is only provider left in group taking new patients and her appts are running [place] said that the [place] office with Dr. Dorris Fetch and Rayetta Pigg, NP could see her sooner.  Please advise how you would like me to proceed with this referral.

## 2021-09-04 NOTE — Telephone Encounter (Signed)
New patient referral request faxed to [facility].

## 2021-09-04 NOTE — Telephone Encounter (Signed)
I would recommend an endocrinologist at [facility].  Patient lives in [place].

## 2021-09-12 NOTE — Telephone Encounter (Signed)
I received note from Dr. Lorie Apley office that they have been trying to reach the patient by phone but no answer and her voice mail box is not set [place]. They called her 7/12, 7/18 and 09/12/2021.  I mailed patient a letter and asked her to call their office to schedule the colonoscopy appointment or let us know if she no longer desires referral.

## 2021-10-10 ENCOUNTER — Other Ambulatory Visit: Payer: Self-pay | Admitting: Home Modifications

## 2021-10-10 DIAGNOSIS — E039 Hypothyroidism, unspecified: Secondary | ICD-10-CM

## 2021-10-15 ENCOUNTER — Ambulatory Visit
Admission: RE | Admit: 2021-10-15 | Discharge: 2021-10-15 | Disposition: A | Discharge status: Home / Self Care | Payer: Self-pay | Source: Ambulatory Visit | Attending: Home Modifications | Admitting: Home Modifications

## 2021-10-15 DIAGNOSIS — E039 Hypothyroidism, unspecified: Secondary | ICD-10-CM

## 2021-10-15 NOTE — Telephone Encounter (Signed)
I have reviewed this encounter and closed it.  Patient may call Dr. Lorie Apley office to schedule.
